# Patient Record
Sex: Female | Born: 1960 | Race: Black or African American | Hispanic: No | Smoking: Never smoker
Health system: Southern US, Community
[De-identification: ages and names within clinical notes are randomized; demographics above are authoritative.]

## PROBLEM LIST (undated history)

## (undated) DIAGNOSIS — I10 Essential (primary) hypertension: Secondary | ICD-10-CM

## (undated) DIAGNOSIS — G43909 Migraine, unspecified, not intractable, without status migrainosus: Secondary | ICD-10-CM

## (undated) DIAGNOSIS — T7840XA Allergy, unspecified, initial encounter: Secondary | ICD-10-CM

## (undated) DIAGNOSIS — Z789 Other specified health status: Secondary | ICD-10-CM

## (undated) DIAGNOSIS — IMO0001 Reserved for inherently not codable concepts without codable children: Secondary | ICD-10-CM

## (undated) DIAGNOSIS — E669 Obesity, unspecified: Secondary | ICD-10-CM

## (undated) HISTORY — DX: Allergy, unspecified, initial encounter: T78.40XA

## (undated) HISTORY — PX: ABDOMINAL HYSTERECTOMY: SHX81

## (undated) HISTORY — PX: TUBAL LIGATION: SHX77

## (undated) HISTORY — DX: Obesity, unspecified: E66.9

## (undated) HISTORY — PX: PARTIAL HYSTERECTOMY: SHX80

## (undated) HISTORY — DX: Essential (primary) hypertension: I10

## (undated) HISTORY — DX: Migraine, unspecified, not intractable, without status migrainosus: G43.909

---

## 1991-04-11 HISTORY — PX: HYSTERECTOMY: SHX81

## 2013-08-14 ENCOUNTER — Emergency Department: Payer: No Typology Code available for payment source

## 2013-08-14 ENCOUNTER — Emergency Department
Admission: EM | Admit: 2013-08-14 | Discharge: 2013-08-14 | Disposition: A | Discharge status: Home / Self Care | Payer: No Typology Code available for payment source | Attending: Emergency Medicine | Admitting: Emergency Medicine

## 2013-08-14 DIAGNOSIS — R519 Headache, unspecified: Secondary | ICD-10-CM

## 2013-08-14 DIAGNOSIS — R51 Headache: Secondary | ICD-10-CM | POA: Insufficient documentation

## 2013-08-14 DIAGNOSIS — I10 Essential (primary) hypertension: Secondary | ICD-10-CM | POA: Insufficient documentation

## 2013-08-14 DIAGNOSIS — Z8669 Personal history of other diseases of the nervous system and sense organs: Secondary | ICD-10-CM | POA: Insufficient documentation

## 2013-08-14 DIAGNOSIS — R0789 Other chest pain: Secondary | ICD-10-CM | POA: Insufficient documentation

## 2013-08-14 HISTORY — DX: Essential (primary) hypertension: I10

## 2013-08-14 HISTORY — DX: Migraine, unspecified, not intractable, without status migrainosus: G43.909

## 2013-08-14 HISTORY — DX: Obesity, unspecified: E66.9

## 2013-08-14 LAB — CBC AND DIFFERENTIAL
Basophils Absolute Automated: 0.01 10*3/uL (ref 0.00–0.20)
Basophils Automated: 0 %
Eosinophils Absolute Automated: 0.14 10*3/uL (ref 0.00–0.70)
Eosinophils Automated: 2 %
Hematocrit: 42.3 % (ref 37.0–47.0)
Hgb: 13.9 g/dL (ref 12.0–16.0)
Immature Granulocytes Absolute: 0.01 10*3/uL
Immature Granulocytes: 0 %
Lymphocytes Absolute Automated: 1.96 10*3/uL (ref 0.50–4.40)
Lymphocytes Automated: 28 %
MCH: 29 pg (ref 28.0–32.0)
MCHC: 32.9 g/dL (ref 32.0–36.0)
MCV: 88.3 fL (ref 80.0–100.0)
MPV: 10 fL (ref 9.4–12.3)
Monocytes Absolute Automated: 0.51 10*3/uL (ref 0.00–1.20)
Monocytes: 7 %
Neutrophils Absolute: 4.41 10*3/uL (ref 1.80–8.10)
Neutrophils: 63 %
Nucleated RBC: 0 /100 WBC (ref 0–1)
Platelets: 296 10*3/uL (ref 140–400)
RBC: 4.79 10*6/uL (ref 4.20–5.40)
RDW: 13 % (ref 12–15)
WBC: 7.03 10*3/uL (ref 3.50–10.80)

## 2013-08-14 LAB — COMPREHENSIVE METABOLIC PANEL
ALT: 16 U/L (ref 0–55)
AST (SGOT): 14 U/L (ref 5–34)
Albumin/Globulin Ratio: 0.9 (ref 0.9–2.2)
Albumin: 3.3 g/dL — ABNORMAL LOW (ref 3.5–5.0)
Alkaline Phosphatase: 112 U/L — ABNORMAL HIGH (ref 37–106)
Anion Gap: 6 (ref 5.0–15.0)
BUN: 12 mg/dL (ref 7.0–19.0)
Bilirubin, Total: 0.4 mg/dL (ref 0.2–1.2)
CO2: 27 mEq/L (ref 22–29)
Calcium: 9.6 mg/dL (ref 8.5–10.5)
Chloride: 109 mEq/L (ref 100–111)
Creatinine: 0.9 mg/dL (ref 0.6–1.0)
Globulin: 3.7 g/dL — ABNORMAL HIGH (ref 2.0–3.6)
Glucose: 92 mg/dL (ref 70–100)
Potassium: 4 mEq/L (ref 3.5–5.1)
Protein, Total: 7 g/dL (ref 6.0–8.3)
Sodium: 142 mEq/L (ref 136–145)

## 2013-08-14 LAB — TROPONIN I: Troponin I: <0.01 ng/mL

## 2013-08-14 LAB — GFR: EGFR: >60

## 2013-08-14 LAB — HEMOLYSIS INDEX: Hemolysis Index: 8 (ref 0–18)

## 2013-08-14 MED ORDER — SODIUM CHLORIDE 0.9 % IV BOLUS
1000.0000 mL | Freq: Once | INTRAVENOUS | Status: AC
Start: 2013-08-14 — End: 2013-08-14
  Administered 2013-08-14: 1000 mL via INTRAVENOUS

## 2013-08-14 MED ORDER — BUTALBITAL-APAP-CAFFEINE 50-325-40 MG PO TABS
1.0000 | ORAL_TABLET | ORAL | Status: AC | PRN
Start: 2013-08-14 — End: ?

## 2013-08-14 MED ORDER — ONDANSETRON HCL 4 MG/2ML IJ SOLN
4.0000 mg | Freq: Once | INTRAMUSCULAR | Status: AC
Start: 2013-08-14 — End: 2013-08-14
  Administered 2013-08-14: 4 mg via INTRAVENOUS
  Filled 2013-08-14: qty 2

## 2013-08-14 MED ORDER — KETOROLAC TROMETHAMINE 30 MG/ML IJ SOLN
30.0000 mg | Freq: Once | INTRAMUSCULAR | Status: AC
Start: 2013-08-14 — End: 2013-08-14
  Administered 2013-08-14: 30 mg via INTRAVENOUS
  Filled 2013-08-14: qty 1

## 2013-08-14 MED ORDER — MAGNESIUM SULFATE IN D5W 10-5 MG/ML-% IV SOLN
1.0000 g | Freq: Once | INTRAVENOUS | Status: AC
Start: 2013-08-14 — End: 2013-08-14
  Administered 2013-08-14: 1 g via INTRAVENOUS
  Filled 2013-08-14: qty 100

## 2013-08-14 NOTE — ED Provider Notes (Signed)
EMERGENCY DEPARTMENT HISTORY AND PHYSICAL EXAM     Physician/Midlevel provider first contact with patient: 08/14/13 0951         Date: 08/14/2013  Patient Name: Monique Dean    History of Presenting Illness     Chief Complaint   Patient presents with   . Chest Pain   . Headache       History Provided By: Patient    Chief Complaint: Headache  Onset: Last night  Timing: Persistent  Location: Diffuse  Quality: Similar to past headaches  Severity: Moderate  Modifying Factors: No pain medication PTA.  Associated Symptoms: Chest pain, sound sensitivity, difficulty sleeping, bilateral foot swelling, and anxiety    Additional History: Monique Dean is a 53 y.o. female presenting to the ED with persistent diffuse headache since last night described as similar to past headaches. Pt has experienced relief in the past with Excedrin, but did not take any PTA. Patient also complains of gradually worsening chest pain ("pressure") radiating into neck since last night. Pt reports she has had similar CP with anxiety in the past, and it is not improved or worsened by anything. Patient also complains of sound sensitivity, difficulty sleeping, bilateral foot swelling, and anxiety, but denies any other Sx including head trauma, fever, chills, photophobia, back pain, abdominal pain, urine Sx, change in BM, cough, cold, or congestion. Patient admits to Hx migraines (since 53 y.o.) and states that she has had imaging and neurological workup in the past, but her neurologist was detained and she never received her results. Pt also admits to Hx HTN and is S/P partial hysterectomy and tubal ligation, but denies Hx cardiac issues (has had unremarkable cardiac workup in the past). Patient may have allergies to Reglan, Toradol, Compazine, and Iodine.     PCP: Gladis Riffle, MD      Current Facility-Administered Medications   Medication Dose Route Frequency Provider Last Rate Last Dose   . [COMPLETED] ketorolac (TORADOL) injection 30 mg  30 mg  Intravenous Once Windell Hummingbird, DO   30 mg at 08/14/13 1053   . [COMPLETED] magnesium sulfate 1g in dextrose 5% IVPB (premix)  1 g Intravenous Once Windell Hummingbird, DO   1 g at 08/14/13 1053   . [COMPLETED] ondansetron (ZOFRAN) injection 4 mg  4 mg Intravenous Once Windell Hummingbird, DO   4 mg at 08/14/13 1052   . [COMPLETED] sodium chloride 0.9 % bolus 1,000 mL  1,000 mL Intravenous Once Windell Hummingbird, DO   1,000 mL at 08/14/13 1059     Current Outpatient Prescriptions   Medication Sig Dispense Refill   . butalbital-acetaminophen-caffeine (FIORICET) 50-325-40 MG per tablet Take 1 tablet by mouth every 4 (four) hours as needed for Pain.  30 tablet  0   . verapamil (VERELAN PM) 240 MG 24 hr capsule Take 240 mg by mouth daily.           Past History     Past Medical History:  Past Medical History   Diagnosis Date   . Obesity    . Migraine headache    . Hypertension        Past Surgical History:  Past Surgical History   Procedure Date   . Partial hysterectomy    . Tubal ligation        Family History:  Family History   Problem Relation Age of Onset   . Hypertension Mother    . Hypertension Sister    . Hypertension  Maternal Grandmother        Social History:  History   Substance Use Topics   . Smoking status: Never Smoker    . Smokeless tobacco: Not on file   . Alcohol Use: Yes      Comment: rarely       Allergies:  Allergies   Allergen Reactions   . Reglan (Metoclopramide) Other (See Comments)     Lock jaw   . Toradol (Ketorolac Tromethamine) Other (See Comments)     Pt unsure if the reglan or the toradol caused locked jaw but wants to avoid both medicines because they were given close together in time   . Compazine (Prochlorperazine) Anxiety   . Iodine Rash and Other (See Comments)     Reaction also was chest heaviness. Allergic to ct scan dye       Review of Systems     Review of Systems   Constitutional:        + Difficulty sleeping   HENT:        + Sensitivity to sound   Cardiovascular: Positive for  chest pain and leg swelling (Bilateral feet).   Musculoskeletal: Positive for neck pain (Radiating from chest).   Neurological: Positive for headaches.   Endo/Heme/Allergies: Positive for environmental allergies.   Psychiatric/Behavioral: The patient is nervous/anxious.    All other systems reviewed and are negative.          Physical Exam   BP 169/90  Pulse 84  Temp 97.9 F (36.6 C) (Oral)  Resp 14  Ht 1.753 m  Wt 136.079 kg  BMI 44.28 kg/m2  SpO2 97%    Physical Exam   Constitutional: She is oriented to person, place, and time and well-developed, well-nourished, and in no distress. No distress.   HENT:   Head: Normocephalic and atraumatic.   Right Ear: External ear normal.   Left Ear: External ear normal.   Nose: Nose normal.   Mouth/Throat: Oropharynx is clear and moist.   Eyes: Conjunctivae normal and EOM are normal. Pupils are equal, round, and reactive to light.   Neck: Normal range of motion. Neck supple. No JVD present.   Cardiovascular: Normal rate, regular rhythm, normal heart sounds and intact distal pulses.    No murmur heard.  Pulmonary/Chest: Effort normal and breath sounds normal. No respiratory distress. She has no wheezes. She exhibits no tenderness.   Abdominal: Soft. She exhibits no distension. There is no tenderness. There is no rebound and no guarding.   Musculoskeletal: Normal range of motion. She exhibits no edema and no tenderness.   Neurological: She is alert and oriented to person, place, and time. No cranial nerve deficit. She exhibits normal muscle tone. Coordination normal. GCS score is 15.   Skin: Skin is warm and dry. No rash noted.   Psychiatric: Mood, affect and judgment normal.       Diagnostic Study Results     Labs -     Results     Procedure Component Value Units Date/Time    Troponin I [[id number]] Collected:08/14/13 1036    Specimen Information:Blood Updated:08/14/13 1111     Troponin I <0.01 ng/mL     Comprehensive metabolic panel [[id number]]  (Abnormal)  Collected:08/14/13 1036    Specimen Information:Blood Updated:08/14/13 1104     Glucose 92 mg/dL      BUN 78.2 mg/dL      Creatinine 0.9 mg/dL      Sodium 956 mEq/L      Potassium 4.0 mEq/L  Chloride 109 mEq/L      CO2 27 mEq/L      CALCIUM 9.6 mg/dL      Protein, Total 7.0 g/dL      Albumin 3.3 (L) g/dL      AST (SGOT) 14 U/L      ALT 16 U/L      Alkaline Phosphatase 112 (H) U/L      Bilirubin, Total 0.4 mg/dL      Globulin 3.7 (H) g/dL      Albumin/Globulin Ratio 0.9      Anion Gap 6.0     Hemolysis index [[id number]] Collected:08/14/13 1036     Hemolysis Index 8 Updated:08/14/13 1104    GFR [[id number]] Collected:08/14/13 1036     EGFR >60.0 Updated:08/14/13 1104    CBC and differential [[id number]] Collected:08/14/13 1036    Specimen Information:Blood / Blood Updated:08/14/13 1048     WBC 7.03 x10 3/uL      RBC 4.79 x10 6/uL      Hgb 13.9 g/dL      Hematocrit 08.6 %      MCV 88.3 fL      MCH 29.0 pg      MCHC 32.9 g/dL      RDW 13 %      Platelets 296 x10 3/uL      MPV 10.0 fL      Neutrophils 63 %      Lymphocytes Automated 28 %      Monocytes 7 %      Eosinophils Automated 2 %      Basophils Automated 0 %      Immature Granulocyte 0 %      Nucleated RBC 0 /100 WBC      Neutrophils Absolute 4.41 x10 3/uL      Abs Lymph Automated 1.96 x10 3/uL      Abs Mono Automated 0.51 x10 3/uL      Abs Eos Automated 0.14 x10 3/uL      Absolute Baso Automated 0.01 x10 3/uL      Absolute Immature Granulocyte 0.01 x10 3/uL           Radiologic Studies -   Radiology Results (24 Hour)     Procedure Component Value Units Date/Time    XR Chest 2 Views [[id number]] Collected:08/14/13 1134    Order Status:Completed  Updated:08/14/13 1139    Narrative:    XR CHEST 2 VIEWS    CLINICAL INDICATION:   chest pain    COMPARISON: None    TECHNIQUE: The following radiographs were obtained per protocol:   XR  CHEST 2 VIEWS    FINDINGS:  Cardiomediastinal silhouette is within normal limits. No  alveolar infiltrate. No effusion. No active  disease.  The osseous  structures are unremarkable. No acute abnormalities are apparent.      Impression:         NO SIGNIFICANT ABNORMALITY.    Darnelle Maffucci, MD   08/14/2013 11:35 AM        .      Medical Decision Making   I am the first provider for this patient.    I reviewed the vital signs, available nursing notes, past medical history, past surgical history, family history and social history.    Vital Signs-Reviewed the patient's vital signs.     Patient Vitals for the past 12 hrs:   BP Temp Pulse Resp   08/14/13 1146 169/90 mmHg 97.9 F (36.6 C) 84  14    08/14/13  0942 192/93 mmHg 97.5 F (36.4 C) 80  16        Pulse Oximetry Analysis - Normal 97% on RA    EKG:  Interpreted by the EP.   Time Interpreted: 10:01 AM   Rate: 75 bpm   Rhythm: Normal Sinus Rhythm    Interpretation: Normal intervals. No STEMI.   Comparison: No prior study is available for comparison.    Old Medical Records: Nursing notes.     ED Course:   11:47 AM  Pt is feeling better and would like to go home.  Discussed test results with pt and counseled on diagnosis, f/u plans, and signs and symptoms when to return to ED.  Pt is stable and ready for discharge.      Provider Notes: HDN, afebrile with chest pain and headache; chest pain starting after headaches last night.  Neuro intact, no h/o trauma/fevers.  Prior imaging/work up done by neurology without acute findings.  Chest pain, constant since last night, patient relates to anxiety; no exertional component and normal EKG/CXR/TROP in ED.  Doubt ACS.  As for headaches, patient comfortable with outpatient treatment and will follow up with PCP.    Procedures:    Core Measures:    Critical Care Time:     Diagnosis     Clinical Impression:   1. Headache    2. Non-cardiac chest pain        _______________________________    Attestations:  This note is prepared by Cathren Laine, acting as Scribe for Channing Mutters, MD.    Channing Mutters, MD: The scribe's documentation has been prepared under my  direction and personally reviewed by me in its entirety.  I confirm that the note above accurately reflects all work, treatment, procedures, and medical decision making performed by me.      _______________________________          Windell Hummingbird, DO  08/14/13 1238

## 2013-08-14 NOTE — ED Notes (Signed)
Pt said the constant heaviness to the sternal area radiate up the right neck chest pain started yesterday evening. No n/v.pt denies photophobia. Pt c/o aching, throbbing headache to the top of the head and forehead area and the back of the neck . Denies injury to any part of her body.

## 2013-08-17 LAB — ECG 12-LEAD
Atrial Rate: 75 {beats}/min
P Axis: 18 degrees
P-R Interval: 140 ms
Q-T Interval: 398 ms
QRS Duration: 78 ms
QTC Calculation (Bezet): 444 ms
R Axis: -9 degrees
T Axis: -2 degrees
Ventricular Rate: 75 {beats}/min

## 2013-09-05 ENCOUNTER — Ambulatory Visit
Payer: No Typology Code available for payment source | Attending: Student in an Organized Health Care Education/Training Program

## 2013-09-05 ENCOUNTER — Other Ambulatory Visit: Payer: Self-pay | Admitting: Student in an Organized Health Care Education/Training Program

## 2013-09-05 DIAGNOSIS — Z1231 Encounter for screening mammogram for malignant neoplasm of breast: Secondary | ICD-10-CM

## 2013-09-08 ENCOUNTER — Other Ambulatory Visit: Payer: Self-pay | Admitting: Student in an Organized Health Care Education/Training Program

## 2013-09-08 DIAGNOSIS — R928 Other abnormal and inconclusive findings on diagnostic imaging of breast: Secondary | ICD-10-CM

## 2013-11-10 ENCOUNTER — Ambulatory Visit: Payer: No Typology Code available for payment source

## 2015-01-19 ENCOUNTER — Ambulatory Visit (INDEPENDENT_AMBULATORY_CARE_PROVIDER_SITE_OTHER): Payer: BLUE CROSS/BLUE SHIELD | Admitting: Physician Assistant

## 2015-01-19 VITALS — BP 142/84 | HR 76 | Temp 98.5°F | Resp 16 | Ht 68.5 in | Wt 318.4 lb

## 2015-01-19 DIAGNOSIS — I10 Essential (primary) hypertension: Secondary | ICD-10-CM

## 2015-01-19 DIAGNOSIS — J069 Acute upper respiratory infection, unspecified: Secondary | ICD-10-CM | POA: Diagnosis not present

## 2015-01-19 MED ORDER — IPRATROPIUM BROMIDE 0.03 % NA SOLN: 2.0000 | Freq: Two times a day (BID) | NASAL | Status: DC

## 2015-01-19 MED ORDER — HYDROCOD POLST-CPM POLST ER 10-8 MG/5ML PO SUER: 5.0000 mL | Freq: Every evening | ORAL | Status: DC | PRN

## 2015-01-19 MED ORDER — BENZONATATE 100 MG PO CAPS: 100.0000 mg | ORAL_CAPSULE | Freq: Three times a day (TID) | ORAL | Status: DC | PRN

## 2015-01-19 MED ORDER — VERAPAMIL HCL ER 240 MG PO CP24: 240.0000 mg | ORAL_CAPSULE | Freq: Once | ORAL | Status: DC

## 2015-01-19 NOTE — Progress Notes (Signed)
Subjective:    Patient ID: Kimberly Mcintyre, female    DOB: 29-Jun-1960, 54 y.o.   MRN: [id number]  HPI Patient presents for sore throat and refill of verapamil.   States that has had sore throat and cough for 1 week. Still able to swallow ok, but has begun loosing her voice. Cough is productive at times, but is keeping her up at night. Coughing spasms have caused HA and CP. Additionally endorses rhinorrhea and congestion. Denies fever, N/V, SOB, sinus pressure. Has seasonal allergies, but no h/o asthma. No known sick contacts.   Moved to [place] 2 months ago from [place], [place] and has been out of verapamil for one month. Denies SOB, palpitations, edema, dizziness, or vision change. Has not found a PCP yet.   Allergies  Allergen Reactions  . 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 Contrast Media [Iodinated Diagnostic Agents] Rash and Other (See Comments)    Dye for the CT contrast, causes heaviness in her chest   Review of Systems  Constitutional: Negative for fever, chills and fatigue.  HENT: Positive for congestion, postnasal drip, rhinorrhea and sore throat. Negative for ear pain, sinus pressure, sneezing and trouble swallowing.   Eyes: Negative.  Negative for visual disturbance.  Respiratory: Positive for cough. Negative for shortness of breath and wheezing.   Cardiovascular: Negative for palpitations and leg swelling.  Gastrointestinal: Negative for nausea and vomiting.  Neurological: Positive for headaches. Negative for dizziness and light-headedness.       Objective:   Physical Exam  Constitutional: She is oriented to person, place, and time. She appears well-developed and well-nourished. No distress.  Blood pressure 142/84, pulse 76, temperature 98.5 F (36.9 C), temperature source Oral, resp. rate 16, height 5' 8.5" (1.74 m), weight 318 lb 6.4 oz (144.425 kg), SpO2  98 %.   HENT:  Head: Normocephalic and atraumatic.  Right Ear: Tympanic membrane, external ear and ear canal normal.  Left Ear: Tympanic membrane, external ear and ear canal normal.  Nose: Rhinorrhea present. Right sinus exhibits no maxillary sinus tenderness and no frontal sinus tenderness. Left sinus exhibits no maxillary sinus tenderness and no frontal sinus tenderness.  Mouth/Throat: Uvula is midline and mucous membranes are normal. Posterior oropharyngeal erythema present. No oropharyngeal exudate or posterior oropharyngeal edema.  Eyes: Conjunctivae are normal. Pupils are equal, round, and reactive to light. Right eye exhibits no discharge. Left eye exhibits no discharge. No scleral icterus.  Neck: Normal range of motion. Neck supple. No thyromegaly present.  Cardiovascular: Normal rate, regular rhythm and normal heart sounds.  Exam reveals no gallop and no friction rub.   No murmur heard. Pulmonary/Chest: Effort normal and breath sounds normal. No respiratory distress. She has no decreased breath sounds. She has no wheezes. She has no rhonchi. She has no rales.  Abdominal: Soft. Bowel sounds are normal. She exhibits no distension. There is no tenderness. There is no rebound and no guarding.  Lymphadenopathy:    She has no cervical adenopathy.  Neurological: She is alert and oriented to person, place, and time.  Skin: Skin is warm and dry. No rash noted. She is not diaphoretic. No erythema.      Assessment & Plan:  1. Essential hypertension Should f/u in 1 month for BP f/u. Lifestyle modifications discussed. - verapamil (VERELAN PM) 240 MG 24 hr capsule; Take 1 capsule (240 mg total)  by mouth once.  Dispense: 30 capsule; Refill: 1  2. Acute upper respiratory infection Plenty of fluid. - ipratropium (ATROVENT) 0.03 % nasal spray; Place 2 sprays into both nostrils 2 (two) times daily.  Dispense: 30 mL; Refill: 0 - benzonatate (TESSALON) 100 MG capsule; Take 1-2 capsules (100-200 mg  total) by mouth 3 (three) times daily as needed for cough.  Dispense: 40 capsule; Refill: 0 - chlorpheniramine-HYDROcodone (TUSSIONEX PENNKINETIC ER) 10-8 MG/5ML SUER; Take 5 mLs by mouth at bedtime as needed for cough.  Dispense: 75 mL; Refill: 0   Keionna Kinnaird PA-C  [facility] 01/19/2015 1:45 PM

## 2015-01-19 NOTE — Patient Instructions (Signed)
Upper Respiratory Infection, Adult Most upper respiratory infections (URIs) are a viral infection of the air passages leading to the lungs. A URI affects the nose, throat, and upper air passages. The most common type of URI is nasopharyngitis and is typically referred to as "the common cold." URIs run their course and usually go away on their own. Most of the time, a URI does not require medical attention, but sometimes a bacterial infection in the upper airways can follow a viral infection. This is called a secondary infection. Sinus and middle ear infections are common types of secondary upper respiratory infections. Bacterial pneumonia can also complicate a URI. A URI can worsen asthma and chronic obstructive pulmonary disease (COPD). Sometimes, these complications can require emergency medical care and may be life threatening.  CAUSES Almost all URIs are caused by viruses. A virus is a type of germ and can spread from one person to another.  RISKS FACTORS You may be at risk for a URI if:   You smoke.   You have chronic heart or lung disease.  You have a weakened defense (immune) system.   You are very young or very old.   You have nasal allergies or asthma.  You work in crowded or poorly ventilated areas.  You work in health care facilities or schools. SIGNS AND SYMPTOMS  Symptoms typically develop 2-3 days after you come in contact with a cold virus. Most viral URIs last 7-10 days. However, viral URIs from the influenza virus (flu virus) can last 14-18 days and are typically more severe. Symptoms may include:   Runny or stuffy (congested) nose.   Sneezing.   Cough.   Sore throat.   Headache.   Fatigue.   Fever.   Loss of appetite.   Pain in your forehead, behind your eyes, and over your cheekbones (sinus pain).  Muscle aches.  DIAGNOSIS  Your health care provider may diagnose a URI by:  Physical exam.  Tests to check that your symptoms are not due to  another condition such as:  Strep throat.  Sinusitis.  Pneumonia.  Asthma. TREATMENT  A URI goes away on its own with time. It cannot be cured with medicines, but medicines may be prescribed or recommended to relieve symptoms. Medicines may help:  Reduce your fever.  Reduce your cough.  Relieve nasal congestion. HOME CARE INSTRUCTIONS   Take medicines only as directed by your health care provider.   Gargle warm saltwater or take cough drops to comfort your throat as directed by your health care provider.  Use a warm mist humidifier or inhale steam from a shower to increase air moisture. This may make it easier to breathe.  Drink enough fluid to keep your urine clear or pale yellow.   Eat soups and other clear broths and maintain good nutrition.   Rest as needed.   Return to work when your temperature has returned to normal or as your health care provider advises. You may need to stay home longer to avoid infecting others. You can also use a face mask and careful hand washing to prevent spread of the virus.  Increase the usage of your inhaler if you have asthma.   Do not use any tobacco products, including cigarettes, chewing tobacco, or electronic cigarettes. If you need help quitting, ask your health care provider. PREVENTION  The best way to protect yourself from getting a cold is to practice good hygiene.   Avoid oral or hand contact with people with cold   symptoms.   Wash your hands often if contact occurs.  There is no clear evidence that vitamin C, vitamin E, echinacea, or exercise reduces the chance of developing a cold. However, it is always recommended to get plenty of rest, exercise, and practice good nutrition.  SEEK MEDICAL CARE IF:   You are getting worse rather than better.   Your symptoms are not controlled by medicine.   You have chills.  You have worsening shortness of breath.  You have brown or red mucus.  You have yellow or brown nasal  discharge.  You have pain in your face, especially when you bend forward.  You have a fever.  You have swollen neck glands.  You have pain while swallowing.  You have white areas in the back of your throat. SEEK IMMEDIATE MEDICAL CARE IF:   You have severe or persistent:  Headache.  Ear pain.  Sinus pain.  Chest pain.  You have chronic lung disease and any of the following:  Wheezing.  Prolonged cough.  Coughing up blood.  A change in your usual mucus.  You have a stiff neck.  You have changes in your:  Vision.  Hearing.  Thinking.  Mood. MAKE SURE YOU:   Understand these instructions.  Will watch your condition.  Will get help right away if you are not doing well or get worse.   This information is not intended to replace advice given to you by your health care provider. Make sure you discuss any questions you have with your health care provider.   Document Released: 09/20/2000 Document Revised: 08/11/2014 Document Reviewed: 07/02/2013 Elsevier Interactive Patient Education 2016 [employer]  

## 2015-03-18 ENCOUNTER — Encounter (HOSPITAL_COMMUNITY): Payer: Self-pay | Admitting: Emergency Medicine

## 2015-03-18 ENCOUNTER — Emergency Department (HOSPITAL_COMMUNITY)
Admission: EM | Admit: 2015-03-18 | Discharge: 2015-03-18 | Disposition: A | Discharge status: Home / Self Care | Payer: BLUE CROSS/BLUE SHIELD | Attending: Emergency Medicine | Admitting: Emergency Medicine

## 2015-03-18 ENCOUNTER — Emergency Department (HOSPITAL_COMMUNITY): Payer: BLUE CROSS/BLUE SHIELD

## 2015-03-18 DIAGNOSIS — R079 Chest pain, unspecified: Secondary | ICD-10-CM | POA: Insufficient documentation

## 2015-03-18 DIAGNOSIS — G43909 Migraine, unspecified, not intractable, without status migrainosus: Secondary | ICD-10-CM | POA: Diagnosis not present

## 2015-03-18 DIAGNOSIS — Z79899 Other long term (current) drug therapy: Secondary | ICD-10-CM | POA: Insufficient documentation

## 2015-03-18 DIAGNOSIS — I10 Essential (primary) hypertension: Secondary | ICD-10-CM | POA: Diagnosis not present

## 2015-03-18 DIAGNOSIS — Z7982 Long term (current) use of aspirin: Secondary | ICD-10-CM | POA: Insufficient documentation

## 2015-03-18 HISTORY — DX: Essential (primary) hypertension: I10

## 2015-03-18 HISTORY — DX: Migraine, unspecified, not intractable, without status migrainosus: G43.909

## 2015-03-18 LAB — BASIC METABOLIC PANEL
Anion gap: 7 (ref 5–15)
BUN: 19 mg/dL (ref 6–20)
CO2: 26 mmol/L (ref 22–32)
Calcium: 9.2 mg/dL (ref 8.9–10.3)
Chloride: 108 mmol/L (ref 101–111)
Creatinine, Ser: 0.88 mg/dL (ref 0.44–1.00)
GFR calc Af Amer: >60 mL/min (ref >60)
GFR calc non Af Amer: >60 mL/min (ref >60)
Glucose, Bld: 93 mg/dL (ref 65–99)
Potassium: 4.1 mmol/L (ref 3.5–5.1)
Sodium: 141 mmol/L (ref 135–145)

## 2015-03-18 LAB — CBC WITH DIFFERENTIAL/PLATELET
Basophils Absolute: 0 10*3/uL (ref 0.0–0.1)
Basophils Relative: 0 %
Eosinophils Absolute: 0.1 10*3/uL (ref 0.0–0.7)
Eosinophils Relative: 1 %
HCT: 42.8 % (ref 36.0–46.0)
Hemoglobin: 13.8 g/dL (ref 12.0–15.0)
Lymphocytes Relative: 31 %
Lymphs Abs: 2.5 10*3/uL (ref 0.7–4.0)
MCH: 29.1 pg (ref 26.0–34.0)
MCHC: 32.2 g/dL (ref 30.0–36.0)
MCV: 90.1 fL (ref 78.0–100.0)
Monocytes Absolute: 0.6 10*3/uL (ref 0.1–1.0)
Monocytes Relative: 7 %
Neutro Abs: 4.7 10*3/uL (ref 1.7–7.7)
Neutrophils Relative %: 61 %
Platelets: 262 10*3/uL (ref 150–400)
RBC: 4.75 MIL/uL (ref 3.87–5.11)
RDW: 13.4 % (ref 11.5–15.5)
WBC: 7.9 10*3/uL (ref 4.0–10.5)

## 2015-03-18 LAB — TROPONIN I
Troponin I: <0.03 ng/mL (ref <0.031)
Troponin I: <0.03 ng/mL (ref <0.031)

## 2015-03-18 MED ORDER — DIAZEPAM 5 MG/ML IJ SOLN
5.0000 mg | Freq: Once | INTRAMUSCULAR | Status: AC
  Administered 2015-03-18: 5 mg via INTRAVENOUS
  Filled 2015-03-18: qty 2

## 2015-03-18 NOTE — ED Notes (Signed)
Pt arrives via [facility] for c/o chest pressure and palpitations that began while she was working. Pt denies sob/n/v, reports some dizziness with activity. Pt received 1 sl nitro and 324mg  asa prior to arrival. Pt alert/oriented, nad.

## 2015-03-18 NOTE — Discharge Instructions (Signed)
Nonspecific Chest Pain It is often hard to find the cause of chest pain. There is always a chance that your pain could be related to something serious, such as a heart attack or a blood clot in your lungs. Chest pain can also be caused by conditions that are not life-threatening. If you have chest pain, it is very important to follow up with your doctor.  HOME CARE  If you were prescribed an antibiotic medicine, finish it all even if you start to feel better.  Avoid any activities that cause chest pain.  Do not use any tobacco products, including cigarettes, chewing tobacco, or electronic cigarettes. If you need help quitting, ask your doctor.  Do not drink alcohol.  Take medicines only as told by your doctor.  Keep all follow-up visits as told by your doctor. This is important. This includes any further testing if your chest pain does not go away.  Your doctor may tell you to keep your head raised (elevated) while you sleep.  Make lifestyle changes as told by your doctor. These may include:  Getting regular exercise. Ask your doctor to suggest some activities that are safe for you.  Eating a heart-healthy diet. Your doctor or a diet specialist (dietitian) can help you to learn healthy eating options.  Maintaining a healthy weight.  Managing diabetes, if necessary.  Reducing stress. GET HELP IF:  Your chest pain does not go away, even after treatment.  You have a rash with blisters on your chest.  You have a fever. GET HELP RIGHT AWAY IF:  Your chest pain is worse.  You have an increasing cough, or you cough up blood.  You have severe belly (abdominal) pain.  You feel extremely weak.  You pass out (faint).  You have chills.  You have sudden, unexplained chest discomfort.  You have sudden, unexplained discomfort in your arms, back, neck, or jaw.  You have shortness of breath at any time.  You suddenly start to sweat, or your skin gets clammy.  You feel  nauseous.  You vomit.  You suddenly feel light-headed or dizzy.  Your heart begins to beat quickly, or it feels like it is skipping beats. These symptoms may be an emergency. Do not wait to see if the symptoms will go away. Get medical help right away. Call your local emergency services (911 in the U[place] Do not drive yourself to the hospital.   This information is not intended to replace advice given to you by your health care provider. Make sure you discuss any questions you have with your health care provider.   Document Released: 09/13/2007 Document Revised: 04/17/2014 Document Reviewed: 10/31/2013 Elsevier Interactive Patient Education 2016 [employer]   Emergency Department Resource Guide 1) Find a Doctor and Pay Out of Pocket Although you won't have to find out who is covered by your insurance plan, it is a good idea to ask around and get recommendations. You will then need to call the office and see if the doctor you have chosen will accept you as a new patient and what types of options they offer for patients who are self-pay. Some doctors offer discounts or will set up payment plans for their patients who do not have insurance, but you will need to ask so you aren't surprised when you get to your appointment.  2) Contact Your Local Health Department Not all health departments have doctors that can see patients for sick visits, but many do, so it is worth a  call to see if yours does. If you don't know where your local health department is, you can check in your phone book. The CDC also has a tool to help you locate your state's health department, and many state websites also have listings of all of their local health departments.  3) Find a Walk-in Clinic If your illness is not likely to be very severe or complicated, you may want to try a walk in clinic. These are popping up all over the country in pharmacies, drugstores, and shopping centers. They're usually staffed by nurse  practitioners or physician assistants that have been trained to treat common illnesses and complaints. They're usually fairly quick and inexpensive. However, if you have serious medical issues or chronic medical problems, these are probably not your best option.  No Primary Care Doctor: - Call Health Connect at  [phone] - they can help you locate a primary care doctor that  accepts your insurance, provides certain services, etc. - Physician Referral Service- [phone]  Chronic Pain Problems: Organization         Address  Phone   Notes  [facility]  [phone] Patients need to be referred by their primary care doctor.   Medication Assistance: Organization         Address  Phone   Notes  [facility] [address] [place], [place] [address] [phone] --Must be a resident of [facility] -- Must have NO insurance coverage whatsoever (no Medicaid/ Medicare, etc.) -- The pt. MUST have a primary care doctor that directs their care regularly and follows them in the community   [facility]  [phone]   [employer]  [phone]    Agencies that provide inexpensive medical care: Organization         Address  Phone   Notes  [facility]  [phone]   [facility]    [phone]   [facility] [address] [place], [place] [address] [phone]   [facility] [address] [place] [address], [place] [phone]   Planned Parenthood    [phone]   [facility]    [phone]   [facility]  [address], [place] Phone:  [phone], Fax:  [phone] Hours of Operation:  9 am - 6 pm, M-F.  Also accepts Medicaid/Medicare and self-pay.  [facility]  [address], [place] Phone: [phone], Fax: [phone]. Hours of Operation:  8:30 am -  5:30 pm, M-F.  Also accepts Medicaid and self-pay.  [facility] [place] [address] [place] Phone: [phone]   [facility] [address] Natasha Bence [place], [place] [phone], Ext. [phone] Mondays & Thursdays: 7-9 AM.  First 15 patients are seen on a first come, first serve basis.    Medicaid-accepting [facility] Providers:  Organization         Address  Phone   Notes  [facility] [address], [place] [phone] Also accepts self-pay patients.  [facility] [address] Laurell Josephs [place], [place]  [phone]   [facility] [address], [place] [phone]   [facility] [address] [place] [phone]   Renaye Rakers [address] [place],  [place]   [phone] Only accepts [place] [employer] patients after they have their name applied to their card.   Self-Pay (no insurance) in [facility]:  Organization         Address  Phone   Notes  Sickle Cell Patients, [facility] [address] [place], [place] [phone]   [facility] [address] [place], [place] [phone]   [facility] [place]  [address], [place] [phone]   [facility]/Dr. Osei-Bonsu  [address] [place] or [address], [place] [phone] Phone number for both [place] and [place] locations is the same.  [facility] [address], [place] [phone]   [facility] [address], [place] or [address] [phone]   [facility] [address] [place] [phone], phone; [phone], fax Sees patients 1st and 3rd Saturday of every month.  Must not qualify for public or private insurance (i.e. Medicaid, Medicare, [place] Health Choice,  Veterans' Benefits)  Household income should be no more than 200% of the poverty level The clinic cannot treat you if you are pregnant or think you are pregnant  Sexually transmitted diseases are not treated at the clinic.    Dental Care: Organization         Address  Phone  Notes  [facility] [address] [place], [place] [phone] Accepts children up to age 33 who are enrolled in [place] or [place] Health Choice; pregnant women with a Medicaid card; and children who have applied for Medicaid or [place] Health Choice, but were declined, whose parents can pay a reduced fee at time of service.  [facility]  [address], [place] [phone] Accepts children up to age 75 who are enrolled in [place] or [place] Health Choice; pregnant women with a Medicaid card; and children who have applied for Medicaid or [place] Health Choice, but were declined, whose parents can pay a reduced fee at time of service.  [facility]  [address] [place], [place] [phone] Patients are seen by appointment only. Walk-ins are not accepted. [facility] will see patients 3 years of age and older. Monday - Tuesday (8am-5pm) Most Wednesdays (8:30-5pm) $30 per visit, cash only  [facility]  [address], [facility] [phone] Patients are seen by appointment only. Walk-ins are not accepted. [facility] will see patients 73 years of age and older. One Wednesday Evening (Monthly: Volunteer Based).  $30 per visit, cash only  [employer]  [phone] for adults; Children under age 52, call Graduate Pediatric Dentistry at [phone]. Children aged 45-14, please call [phone] to request a pediatric application.  Dental services are provided in all areas of dental care including fillings, crowns and bridges, complete and  partial dentures, implants, gum treatment, root canals, and extractions. Preventive care is also provided. Treatment is provided to both adults and children. Patients are selected via a lottery and there is often a waiting list.   [facility] [address] [place]  [phone] [email or web address]   [facility] [address] [place], [place] [phone], Ext. [phone] Second and Fourth Thursday of each month,  opens at 6:30 AM; Clinic ends at 9 AM.  Patients are seen on a first-come first-served basis, and a limited number are seen during each clinic.   [facility]  [address] Ether Griffins [place], [place] [phone]   Eligibility Requirements You must have lived in [place], [place], or [place] counties for at least the last three months.   You cannot be eligible for state or federal sponsored [place], including [employer], [place], or [employer].   You generally cannot be eligible for healthcare insurance through your employer.    How to apply: Eligibility screenings are held every Tuesday and Wednesday afternoon from 1:00 pm until 4:00 pm. You do not need an appointment for the interview!  [facility] [address], [place], [place] [phone]   [facility] Health Department  [phone]   [facility] Health Department  [phone]   [facility] Health Department  [phone]    Behavioral Health Resources in the Community: Intensive Outpatient Programs Organization         Address  Phone  Notes  [facility] Services [address], [place], [place] [phone]   [facility] [address], [place], [place] [phone]   [facility] [address] [place], [place]  [phone]   [facility] Mental Health [address],  [place], [place] [phone] or [phone]   Substance Abuse Resources Organization          Address  Phone  Notes  [facility]  [phone]   [facility]  [phone]   [facility] [place]  [phone]   Floydene Flock  [phone]   Residential & Outpatient Substance Abuse Program  [phone]   Psychological Services Organization         Address  Phone  Notes  [facility] Behavioral Health  [phone]   [facility] Services  [phone]   [facility] Mental Health [address], [place] [phone] or [phone]    Mobile Crisis Teams Organization         Address  Phone  Notes  [facility], Mobile Crisis Care Unit  [phone]   [facility]  [address] [place], [place] [phone]   Doristine Locks [address] [place] [phone]    Self-Help/Support Groups Organization         Address  Phone             Notes  [facility] [place] - variety of support groups  [phone]- [phone] Call for more information  Narcotics Anonymous (NA), [facility] [address], [employer] [place]  2 meetings at this location   Statistician         Address  Phone  Notes  [facility] [address],    [place]  [phone]   [facility]  [address] [place] [address], [place], [place] [phone]   [facility] [address] Cordry [place], [place] [phone] Admissions: 8am-3pm M-F  [facility] [address],    [place], [place] [phone]   [facility] [address] Starling Manns [place], [place] [phone]   [facility] [address]  [place], [place] [phone]   [facility] - Intensive Outpatient [address] Laurell Joseph[address], [place], [place] [phone]   [facility] ([facility] [address]  [place], [place] [phone]  or [phone]   [facility] ([facility]) [address], [place], [place]  [phone] Accepts Medicaid  [facility] [address]  [place] [phone] Substance Abuse/Addiction Treatment   [facility] Organization         Address  Phone  Notes  [facility]  [phone]   Angie Fava, PhD [address] Ervin Knack [place], [place]   [phone] or [phone]   [facility] Behavioral   [address] [place], [place] [phone]   [facility] [address] [place], [place] [phone] Insurance/Medicaid/sponsorship through [facility] [address]                                    [place], [place] [phone] Therapy/tele-psych/case  [facility] [address], [place] [phone]    Dr. Lolly Mustache  [phone]   [facility]  [employer] [facility] Dept. 1) [address] [place] 2) [address], [place] 3)  [address], [place] [phone]  [phone]   [facility] Child Abuse Hotline 801-093-6293 or [phone] (After Hours)

## 2015-03-22 NOTE — ED Provider Notes (Signed)
CSN: [id number]     Arrival date & time 03/18/15  1605 History   First MD Initiated Contact with Patient 03/18/15 1613     Chief Complaint  Patient presents with  . Chest Pain     (Consider location/radiation/quality/duration/timing/severity/associated sxs/prior Treatment) HPI   54 year old female with chest pain. Onset shortly before arrival while at work. She describes sharp pain substernally to the left side of her chest. Last approximately 1-3 minutes and subsides. No appreciable exacerbating relieving factors. Denies any associated symptoms such as shortness of breath, nausea, diaphoresis. No fevers or chills. No cough. No unusual leg pain or swelling. Reports being under increased stress and symptoms may be related to this.   Past Medical History  Diagnosis Date  . Allergy   . Hypertension   . Migraines    Past Surgical History  Procedure Laterality Date  . Abdominal hysterectomy    . Tubal ligation     Family History  Problem Relation Age of Onset  . Hypertension Mother    Social History  Substance Use Topics  . Smoking status: Never Smoker   . Smokeless tobacco: None  . Alcohol Use: 0.0 oz/week    0 Standard drinks or equivalent per week   OB History    No data available     Review of Systems  All systems reviewed and negative, other than as noted in HPI.   Allergies  Reglan; Toradol; Compazine; and Contrast media  Home Medications   Prior to Admission medications   Medication Sig Start Date End Date Taking? Authorizing Provider  aspirin-acetaminophen-caffeine (EXCEDRIN MIGRAINE) [phone] MG tablet Take 1 tablet by mouth every 6 (six) hours as needed for headache.   Yes Historical Provider, MD  verapamil (VERELAN PM) 240 MG 24 hr capsule Take 1 capsule (240 mg total) by mouth once. Patient taking differently: Take 240 mg by mouth daily.  01/19/15  Yes Tishira R Brewington, PA-C  benzonatate (TESSALON) 100 MG capsule Take 1-2 capsules (100-200 mg total)  by mouth 3 (three) times daily as needed for cough. Patient not taking: Reported on 03/18/2015 01/19/15   Tishira R Brewington, PA-C  chlorpheniramine-HYDROcodone (TUSSIONEX PENNKINETIC ER) 10-8 MG/5ML SUER Take 5 mLs by mouth at bedtime as needed for cough. Patient not taking: Reported on 03/18/2015 01/19/15   Tishira R Brewington, PA-C  ipratropium (ATROVENT) 0.03 % nasal spray Place 2 sprays into both nostrils 2 (two) times daily. Patient not taking: Reported on 03/18/2015 01/19/15   Tishira R Brewington, PA-C   BP 140/68 mmHg  Pulse 93  Temp(Src) 97.9 F (36.6 C) (Oral)  Resp 23  SpO2 100% Physical Exam  Constitutional: She appears well-developed and well-nourished. No distress.  HENT:  Head: Normocephalic and atraumatic.  Eyes: Conjunctivae are normal. Right eye exhibits no discharge. Left eye exhibits no discharge.  Neck: Neck supple.  Cardiovascular: Normal rate, regular rhythm and normal heart sounds.  Exam reveals no gallop and no friction rub.   No murmur heard. Pulmonary/Chest: Effort normal and breath sounds normal. No respiratory distress. She exhibits no tenderness.  Abdominal: Soft. She exhibits no distension. There is no tenderness.  Musculoskeletal: She exhibits no edema or tenderness.  Lower extremities symmetric as compared to each other. No calf tenderness. Negative Homan's. No palpable cords.   Neurological: She is alert.  Skin: Skin is warm and dry.  Psychiatric: She has a normal mood and affect. Her behavior is normal. Thought content normal.  Nursing note and vitals reviewed.   ED Course  Procedures (including critical care time) Labs Review Labs Reviewed  CBC WITH DIFFERENTIAL/PLATELET  BASIC METABOLIC PANEL  TROPONIN I  TROPONIN I    Imaging Review No results found. I have personally reviewed and evaluated these images and lab results as part of my medical decision-making.   EKG Interpretation   Date/Time:  Thursday March 18 2015 16:07:14  EST Ventricular Rate:  68 PR Interval:  138 QRS Duration: 88 QT Interval:  419 QTC Calculation: 446 R Axis:   -8 Text Interpretation:  Sinus rhythm Borderline T abnormalities, anterior  and inferior leads No old tracing to compare Confirmed by Khamarion Bjelland  MD,  Elleanna Melling ([id number]) on 03/18/2015 4:17:27 PM      MDM   Final diagnoses:  Chest pain, unspecified chest pain type    54 rolled female with chest pain was seems atypical for ACS. Doubt PE, dissection, serious infection or other emergent pathology. Initial and repeat troponins normal. Chest x-ray without any acute abnormality. It has been determined that no acute conditions requiring further emergency intervention are present at this time. The patient has been advised of the diagnosis and plan. I reviewed any labs and imaging including any potential incidental findings. We have discussed signs and symptoms that warrant return to the ED and they are listed in the discharge instructions.      Raeford Razor, MD 03/22/15 [phone]

## 2015-05-17 ENCOUNTER — Emergency Department (HOSPITAL_COMMUNITY)
Admission: EM | Admit: 2015-05-17 | Discharge: 2015-05-17 | Discharge status: Against Medical Advice | Payer: BLUE CROSS/BLUE SHIELD

## 2015-05-17 NOTE — ED Notes (Signed)
Called pt from waiting rm to triage, no response

## 2015-06-16 ENCOUNTER — Emergency Department (HOSPITAL_COMMUNITY): Payer: Self-pay

## 2015-06-16 ENCOUNTER — Encounter (HOSPITAL_COMMUNITY): Payer: Self-pay

## 2015-06-16 ENCOUNTER — Emergency Department (HOSPITAL_COMMUNITY)
Admission: EM | Admit: 2015-06-16 | Discharge: 2015-06-16 | Disposition: A | Discharge status: Home / Self Care | Payer: Self-pay | Attending: Emergency Medicine | Admitting: Emergency Medicine

## 2015-06-16 DIAGNOSIS — Z79899 Other long term (current) drug therapy: Secondary | ICD-10-CM | POA: Insufficient documentation

## 2015-06-16 DIAGNOSIS — Z8719 Personal history of other diseases of the digestive system: Secondary | ICD-10-CM | POA: Insufficient documentation

## 2015-06-16 DIAGNOSIS — R51 Headache: Secondary | ICD-10-CM

## 2015-06-16 DIAGNOSIS — R1012 Left upper quadrant pain: Secondary | ICD-10-CM | POA: Insufficient documentation

## 2015-06-16 DIAGNOSIS — J3489 Other specified disorders of nose and nasal sinuses: Secondary | ICD-10-CM | POA: Insufficient documentation

## 2015-06-16 DIAGNOSIS — M25512 Pain in left shoulder: Secondary | ICD-10-CM | POA: Insufficient documentation

## 2015-06-16 DIAGNOSIS — Z9851 Tubal ligation status: Secondary | ICD-10-CM | POA: Insufficient documentation

## 2015-06-16 DIAGNOSIS — R519 Headache, unspecified: Secondary | ICD-10-CM

## 2015-06-16 DIAGNOSIS — G43909 Migraine, unspecified, not intractable, without status migrainosus: Secondary | ICD-10-CM | POA: Insufficient documentation

## 2015-06-16 DIAGNOSIS — Z90710 Acquired absence of both cervix and uterus: Secondary | ICD-10-CM | POA: Insufficient documentation

## 2015-06-16 DIAGNOSIS — I10 Essential (primary) hypertension: Secondary | ICD-10-CM | POA: Insufficient documentation

## 2015-06-16 LAB — URINALYSIS, ROUTINE W REFLEX MICROSCOPIC
BILIRUBIN URINE: NEGATIVE
GLUCOSE, UA: NEGATIVE mg/dL
HGB URINE DIPSTICK: NEGATIVE
KETONES UR: NEGATIVE mg/dL
Leukocytes, UA: NEGATIVE
Nitrite: NEGATIVE
PH: 5.5 (ref 5.0–8.0)
PROTEIN: NEGATIVE mg/dL
Specific Gravity, Urine: 1.025 (ref 1.005–1.030)

## 2015-06-16 LAB — CBC
HEMATOCRIT: 44.9 % (ref 36.0–46.0)
Hemoglobin: 15 g/dL (ref 12.0–15.0)
MCH: 29.9 pg (ref 26.0–34.0)
MCHC: 33.4 g/dL (ref 30.0–36.0)
MCV: 89.6 fL (ref 78.0–100.0)
PLATELETS: 264 10*3/uL (ref 150–400)
RBC: 5.01 MIL/uL (ref 3.87–5.11)
RDW: 13.1 % (ref 11.5–15.5)
WBC: 6.1 10*3/uL (ref 4.0–10.5)

## 2015-06-16 LAB — COMPREHENSIVE METABOLIC PANEL
ALT: 18 U/L (ref 14–54)
ANION GAP: 11 (ref 5–15)
AST: 20 U/L (ref 15–41)
Albumin: 4 g/dL (ref 3.5–5.0)
Alkaline Phosphatase: 103 U/L (ref 38–126)
BUN: 15 mg/dL (ref 6–20)
CHLORIDE: 105 mmol/L (ref 101–111)
CO2: 24 mmol/L (ref 22–32)
CREATININE: 0.98 mg/dL (ref 0.44–1.00)
Calcium: 9.2 mg/dL (ref 8.9–10.3)
Glucose, Bld: 91 mg/dL (ref 65–99)
POTASSIUM: 3.9 mmol/L (ref 3.5–5.1)
Sodium: 140 mmol/L (ref 135–145)
Total Bilirubin: 0.3 mg/dL (ref 0.3–1.2)
Total Protein: 8.1 g/dL (ref 6.5–8.1)

## 2015-06-16 LAB — I-STAT TROPONIN, ED: Troponin i, poc: 0 ng/mL (ref 0.00–0.08)

## 2015-06-16 LAB — LIPASE, BLOOD: LIPASE: 25 U/L (ref 11–51)

## 2015-06-16 MED ORDER — KETOROLAC TROMETHAMINE 60 MG/2ML IM SOLN: 60.0000 mg | Freq: Once | INTRAMUSCULAR | Status: DC

## 2015-06-16 MED ORDER — KETOROLAC TROMETHAMINE 30 MG/ML IJ SOLN
30.0000 mg | Freq: Once | INTRAMUSCULAR | Status: AC
  Administered 2015-06-16: 30 mg via INTRAVENOUS
  Filled 2015-06-16: qty 1

## 2015-06-16 MED ORDER — BUTALBITAL-ASA-CAFFEINE 50-325-40 MG PO CAPS: 1.0000 | ORAL_CAPSULE | Freq: Two times a day (BID) | ORAL | Status: AC | PRN

## 2015-06-16 MED ORDER — OXYCODONE-ACETAMINOPHEN 5-325 MG PO TABS
1.0000 | ORAL_TABLET | Freq: Once | ORAL | Status: AC
  Administered 2015-06-16: 1 via ORAL
  Filled 2015-06-16: qty 1

## 2015-06-16 MED ORDER — VERAPAMIL HCL ER 240 MG PO TBCR
240.0000 mg | EXTENDED_RELEASE_TABLET | Freq: Once | ORAL | Status: DC
Start: 2015-06-16 — End: 2015-06-16
  Filled 2015-06-16: qty 1

## 2015-06-16 MED ORDER — VERAPAMIL HCL ER 240 MG PO CP24: 240.0000 mg | ORAL_CAPSULE | Freq: Every day | ORAL | Status: AC

## 2015-06-16 NOTE — ED Provider Notes (Signed)
CSN: [id number]     Arrival date & time 06/16/15  1444 History   First MD Initiated Contact with Patient 06/16/15 1554     Chief Complaint  Patient presents with  . Abdominal Pain  . Hypertension  . Shoulder Pain     (Consider location/radiation/quality/duration/timing/severity/associated sxs/prior Treatment) HPI Comments: Patient with history of hypertension, migraine headaches presents with multiple complaints.  HA/right ear buzzing: Patient states that she has had an atypical headache across the entire head for the past week. She states it feels different than her normal migraine headaches. Unrelieved with OTC meds at home. She feels that it is related to her blood pressure. She states that she can feel a vibration in her right ear with her finger when the pain is worse. No hearing loss. Patient denies signs of stroke including: facial droop, slurred speech, aphasia, weakness/numbness in extremities, imbalance/trouble walking.  L shoulder pain: Patient with pain with movement in her left anterior and lateral shoulder for the past 2 days. Made worse with movement. No numbness or tingling in extremity. No additional chest pain or shortness of breath. Denies injuries. She has not had pain like this before.  Left upper abdominal pain: This is been ongoing for several months. She has been diagnosed with gallbladder sludge in the past. No associated fever, nausea, vomiting, diarrhea currently. No urinary symptoms.  Hypertension: Patient was previously on verapamil 240 mg daily. She states that she has been out of this for approximately one month. She has upcoming PCP appointment next month.   [name] presenting with abdominal pain, hypertension, and shoulder pain. The history is provided by the patient.  Abdominal Pain Hypertension Associated symptoms include abdominal pain.  Shoulder Pain   Past Medical History  Diagnosis Date  . Allergy   . Hypertension   .  Migraines    Past Surgical History  Procedure Laterality Date  . Abdominal hysterectomy    . Tubal ligation     Family History  Problem Relation Age of Onset  . Hypertension Mother    Social History  Substance Use Topics  . Smoking status: Never Smoker   . Smokeless tobacco: None  . Alcohol Use: 0.0 oz/week    0 Standard drinks or equivalent per week     Comment: occ   OB History    No data available     Review of Systems  Gastrointestinal: Positive for abdominal pain.  All other systems reviewed and are negative.     Allergies  Reglan; Compazine; and Contrast media  Home Medications   Prior to Admission medications   Medication Sig Start Date End Date Taking? Authorizing Provider  aspirin-acetaminophen-caffeine (EXCEDRIN MIGRAINE) [phone] MG tablet Take 1 tablet by mouth every 6 (six) hours as needed for headache.   Yes Historical Provider, MD  BIOTIN PO Take 1 tablet by mouth daily.   Yes Historical Provider, MD  diphenhydrAMINE (BENADRYL) 25 MG tablet Take 100 mg by mouth at bedtime as needed for sleep.   Yes Historical Provider, MD  famotidine (PEPCID) 10 MG tablet Take 10 mg by mouth 2 (two) times daily as needed for heartburn or indigestion.   Yes Historical Provider, MD  ipratropium (ATROVENT) 0.03 % nasal spray Place 2 sprays into both nostrils 2 (two) times daily. Patient taking differently: Place 2 sprays into both nostrils 2 (two) times daily as needed (allergies).  01/19/15  Yes Tishira R Brewington, PA-C  Multiple Vitamins-Minerals (MULTIVITAMIN WITH MINERALS) tablet Take 1  tablet by mouth daily.   Yes Historical Provider, MD  verapamil (VERELAN PM) 240 MG 24 hr capsule Take 1 capsule (240 mg total) by mouth once. Patient taking differently: Take 240 mg by mouth daily.  01/19/15  Yes Tishira R Brewington, PA-C  benzonatate (TESSALON) 100 MG capsule Take 1-2 capsules (100-200 mg total) by mouth 3 (three) times daily as needed for cough. Patient not  taking: Reported on 03/18/2015 01/19/15   Tishira R Brewington, PA-C  chlorpheniramine-HYDROcodone (TUSSIONEX PENNKINETIC ER) 10-8 MG/5ML SUER Take 5 mLs by mouth at bedtime as needed for cough. Patient not taking: Reported on 03/18/2015 01/19/15   Tishira R Brewington, PA-C   BP 200/100 mmHg  Pulse 97  Temp(Src) 100.2 F (37.9 C) (Oral)  Resp 18  SpO2 97%   Physical Exam  Constitutional: She is oriented to person, place, and time. She appears well-developed and well-nourished.  HENT:  Head: Normocephalic and atraumatic.  Right Ear: Tympanic membrane, external ear and ear canal normal.  Left Ear: Tympanic membrane, external ear and ear canal normal.  Nose: Rhinorrhea present.  Mouth/Throat: Uvula is midline, oropharynx is clear and moist and mucous membranes are normal.  Eyes: Conjunctivae, EOM and lids are normal. Pupils are equal, round, and reactive to light. Right eye exhibits no discharge. Left eye exhibits no discharge. Right eye exhibits no nystagmus. Left eye exhibits no nystagmus.  Neck: Normal range of motion. Neck supple.  Cardiovascular: Normal rate, regular rhythm and normal heart sounds.   No murmur heard. Pulmonary/Chest: Effort normal and breath sounds normal. No respiratory distress. She has no wheezes. She has no rales.  Abdominal: Soft. She exhibits no distension. There is tenderness (LUQ mild). There is no rebound and no guarding.  Musculoskeletal: She exhibits tenderness. She exhibits no edema.       Right shoulder: Normal.       Left shoulder: She exhibits decreased range of motion (patient cannot abduct past 90 degrees 2/2 pain), tenderness (anterior/lateral to light and deep palp of musculature. ) and pain. She exhibits no bony tenderness, no swelling, no effusion, no deformity, no spasm and normal strength.       Cervical back: She exhibits normal range of motion, no tenderness and no bony tenderness.       Thoracic back: Normal.  Neurological: She is alert and  oriented to person, place, and time. She has normal strength and normal reflexes. No cranial nerve deficit or sensory deficit. She displays a negative Romberg sign. Coordination and gait normal. GCS eye subscore is 4. GCS verbal subscore is 5. GCS motor subscore is 6.  Skin: Skin is warm and dry.  Psychiatric: She has a normal mood and affect.  Nursing note and vitals reviewed.   ED Course  Procedures (including critical care time) Labs Review Labs Reviewed  LIPASE, BLOOD  COMPREHENSIVE METABOLIC PANEL  CBC  URINALYSIS, ROUTINE W REFLEX MICROSCOPIC (NOT AT [facility])  I-STAT TROPOININ, ED    Imaging Review Ct Head Wo Contrast  06/16/2015  CLINICAL DATA:  Buzzing in LEFT ear, LEFT shoulder and arm pain for 2 days, headache for 1 week, ran out of her medication for hypertension 1 month ago EXAM: CT HEAD WITHOUT CONTRAST TECHNIQUE: Contiguous axial images were obtained from the base of the skull through the vertex without intravenous contrast. COMPARISON:  None FINDINGS: Normal ventricular morphology. No midline shift or mass effect. Normal appearance of brain parenchyma. No intracranial hemorrhage, mass lesion, or acute infarction. Visualized paranasal sinuses and mastoid air  cells clear. Bones unremarkable. IMPRESSION: Normal exam. Electronically Signed   By: Ulyses Southward M.D.   On: 06/16/2015 17:06   Dg Shoulder Left  06/16/2015  CLINICAL DATA:  Left shoulder pain for 2 days, no injury EXAM: LEFT SHOULDER - 2+ VIEW COMPARISON:  None FINDINGS: The left humeral head is in normal position and the glenohumeral joint space appears normal. The left AC joint is normally aligned. The sub acromial joint space appears normal. The ribs that are visualized are intact. IMPRESSION: Negative. Electronically Signed   By: Dwyane Dee M.D.   On: 06/16/2015 17:17   I have personally reviewed and evaluated these images and lab results as part of my medical decision-making.   EKG Interpretation   Date/Time:   Wednesday June 16 2015 15:36:23 EST Ventricular Rate:  91 PR Interval:  143 QRS Duration: 84 QT Interval:  342 QTC Calculation: 421 R Axis:   -16 Text Interpretation:  Sinus rhythm Borderline left axis deviation Low  voltage, precordial leads RSR' in V1 or V2, probably normal variant  Confirmed by Lincoln Brigham [id number]) on 06/16/2015 4:07:21 PM       4:10 PM Patient seen and examined. EKG reviewed. L shoulder pain is readily reproducible and is not likely ACS. Work-up initiated. Medications ordered.   Vital signs reviewed and are as follows: BP 200/100 mmHg  Pulse 97  Temp(Src) 100.2 F (37.9 C) (Oral)  Resp 18  SpO2 97%  6:44 PM Work-up is neg, discussed with patient. BP improved without treatment here. Recheck of temperature is 99.73F. Patient voiced frustration that she was only given Toradol for her headache. She is requesting IV dilaudid or morphine stating that she needs this when her headache is this bad. We discussed appropriate treatments for headaches, I offered PO percocet for her shoulder pain prior to discharge and she accepts. She states that she would like to go home.   Will also provide 1 month RX of verapamil.   The patient was urged to return to the Emergency Department immediately with worsening of current symptoms, worsening abdominal pain, persistent vomiting, blood noted in stools, fever, or any other concerns. The patient verbalized understanding.   Patient was counseled on head injury precautions and symptoms that should indicate their return to the ED.  These include severe worsening headache, vision changes, confusion, loss of consciousness, trouble walking, nausea & vomiting, or weakness/tingling in extremities.    BP 162/92 mmHg  Pulse 96  Temp(Src) 99.7 F (37.6 C) (Oral)  Resp 20  SpO2 100%   MDM   Final diagnoses:  Left upper quadrant pain  Acute nonintractable headache, unspecified headache type  Left shoulder pain  Essential hypertension   LUQ  pain: This is acute on chronic, mild tenderness on palpation. No fever, N/V/D. Normal LFTs and lipase. Normal hemoglobin. This can further be evaluated by PCP.  Headache: Head CT neg. No temporal artery tenderness. Patient without high-risk features of headache including: sudden onset/thunderclap HA, altered mental status, accompanying seizure, headache with exertion, history of immunocompromise, neck or shoulder pain, fever, use of anticoagulation, family history of spontaneous SAH, concomitant drug use, toxic exposure.   Patient has a normal complete neurological exam, normal vital signs, normal level of consciousness, no signs of meningismus, is well-appearing/non-toxic appearing, no signs of trauma.   Shoulder pain: Imaging negative. Slightly decreased ROM. No elevated WBC, exam is not really suggestive of septic arthritis. Ortho referral given. Tylenol for pain.   HTN: Improved spontaneously. No other evidence of  end organ damage. Will restart on hypertension meds.  No dangerous or life-threatening conditions suspected or identified by history, physical exam, and by work-up. No indications for hospitalization identified.      Renne Crigler, PA-C 06/16/15 1910  Tilden Fossa, MD 06/18/15 [phone]

## 2015-06-16 NOTE — Progress Notes (Signed)
Patient noted to have been seen in the Ed 3 times within the last six months.  EDCM spoke to patient at bedside.  Patient reports she has [employer] insurance and [facility].  Patient reports she has a doctor appointment to establish care on April 17.  She reports when she was at urgent care she was referred to this new doctor but she cannot remember the name of the doctor she was referred to.  She would like a sooner appointment than April 17.  EDCM provided patient a list of pcps who accept [employer] insurance within a 5 mile radius of her zip code.  Patient thankful for resources.  No further EDCm needs at this time.

## 2015-06-16 NOTE — ED Notes (Signed)
Patient asking for prescription for migraine medication prior to leaving, will ask PA

## 2015-06-16 NOTE — ED Notes (Signed)
MADE TWO UNSUCCESSFUL TRIES TO COLLECT BLOOD SAMPLES

## 2015-06-16 NOTE — ED Notes (Signed)
Pain medication given to patient, instructed not to drive home after taking medication.

## 2015-06-16 NOTE — Discharge Instructions (Signed)
Please read and follow all provided instructions.  Your diagnoses today include:  1. Left upper quadrant pain   2. Acute nonintractable headache, unspecified headache type   3. Left shoulder pain   4. Essential hypertension    Tests performed today include:  Blood counts and electrolytes  Blood tests to check liver and kidney function  Blood tests to check pancreas function  Urine test to look for infection - normal  Blood test for heart attack - negative  Vital signs. See below for your results today.   Medications prescribed:   Verapamil   Take any prescribed medications only as directed.  Home care instructions:   Follow any educational materials contained in this packet.  Follow-up instructions: Please follow-up with your primary care provider in the next 7 days for further evaluation of your symptoms.    Return instructions:  SEEK IMMEDIATE MEDICAL ATTENTION IF:  The pain does not go away or becomes severe   A temperature above 101F develops   Repeated vomiting occurs (multiple episodes)   The pain becomes localized to portions of the abdomen. The right side could possibly be appendicitis. In an adult, the left lower portion of the abdomen could be colitis or diverticulitis.   Blood is being passed in stools or vomit (bright red or black tarry stools)   You develop chest pain, difficulty breathing, dizziness or fainting, or become confused, poorly responsive, or inconsolable (young children)  If you have any other emergent concerns regarding your health  Additional Information: Abdominal (belly) pain can be caused by many things. Your caregiver performed an examination and possibly ordered blood/urine tests and imaging (CT scan, x-rays, ultrasound). Many cases can be observed and treated at home after initial evaluation in the emergency department. Even though you are being discharged home, abdominal pain can be unpredictable. Therefore, you need a repeated  exam if your pain does not resolve, returns, or worsens. Most patients with abdominal pain don't have to be admitted to the hospital or have surgery, but serious problems like appendicitis and gallbladder attacks can start out as nonspecific pain. Many abdominal conditions cannot be diagnosed in one visit, so follow-up evaluations are very important.  Your vital signs today were: BP 162/92 mmHg   Pulse 96   Temp(Src) 99.7 F (37.6 C) (Oral)   Resp 20   SpO2 100% If your blood pressure (bp) was elevated above 135/85 this visit, please have this repeated by your doctor within one month. --------------

## 2015-06-16 NOTE — ED Notes (Signed)
Pt c/o "buzzing" in L ear, LUQ abdominal pain, nausea, and L shoulder/arm pain x 2 days and headache x 1 week.  Pain score 7/10.  Pt reports that she ran out of HTN medication x 1 month ago.  Sts she is new to the area and cannot get into new MD until mid-April.  Pt reports she was previously diagnosed w/ "sludge."

## 2015-08-29 ENCOUNTER — Inpatient Hospital Stay (HOSPITAL_COMMUNITY)
Admission: EM | Admit: 2015-08-29 | Discharge: 2015-09-09 | DRG: 917 | Disposition: E | Payer: Managed Care, Other (non HMO) | Attending: Pulmonary Disease | Admitting: Pulmonary Disease

## 2015-08-29 ENCOUNTER — Encounter (HOSPITAL_COMMUNITY): Payer: Self-pay | Admitting: Emergency Medicine

## 2015-08-29 ENCOUNTER — Emergency Department (HOSPITAL_COMMUNITY): Payer: Managed Care, Other (non HMO)

## 2015-08-29 DIAGNOSIS — G9341 Metabolic encephalopathy: Secondary | ICD-10-CM | POA: Diagnosis present

## 2015-08-29 DIAGNOSIS — F329 Major depressive disorder, single episode, unspecified: Secondary | ICD-10-CM | POA: Diagnosis present

## 2015-08-29 DIAGNOSIS — I442 Atrioventricular block, complete: Secondary | ICD-10-CM | POA: Diagnosis present

## 2015-08-29 DIAGNOSIS — T461X2A Poisoning by calcium-channel blockers, intentional self-harm, initial encounter: Secondary | ICD-10-CM | POA: Diagnosis present

## 2015-08-29 DIAGNOSIS — N179 Acute kidney failure, unspecified: Secondary | ICD-10-CM | POA: Diagnosis present

## 2015-08-29 DIAGNOSIS — T43222A Poisoning by selective serotonin reuptake inhibitors, intentional self-harm, initial encounter: Secondary | ICD-10-CM | POA: Diagnosis present

## 2015-08-29 DIAGNOSIS — T50902A Poisoning by unspecified drugs, medicaments and biological substances, intentional self-harm, initial encounter: Secondary | ICD-10-CM

## 2015-08-29 DIAGNOSIS — J9600 Acute respiratory failure, unspecified whether with hypoxia or hypercapnia: Secondary | ICD-10-CM

## 2015-08-29 DIAGNOSIS — J690 Pneumonitis due to inhalation of food and vomit: Secondary | ICD-10-CM | POA: Diagnosis present

## 2015-08-29 DIAGNOSIS — R4020 Unspecified coma: Secondary | ICD-10-CM | POA: Diagnosis present

## 2015-08-29 DIAGNOSIS — J80 Acute respiratory distress syndrome: Secondary | ICD-10-CM | POA: Diagnosis present

## 2015-08-29 DIAGNOSIS — I4589 Other specified conduction disorders: Secondary | ICD-10-CM | POA: Diagnosis present

## 2015-08-29 DIAGNOSIS — T1491 Suicide attempt: Secondary | ICD-10-CM

## 2015-08-29 DIAGNOSIS — I952 Hypotension due to drugs: Secondary | ICD-10-CM | POA: Diagnosis present

## 2015-08-29 DIAGNOSIS — A419 Sepsis, unspecified organism: Secondary | ICD-10-CM | POA: Diagnosis not present

## 2015-08-29 DIAGNOSIS — Z4659 Encounter for fitting and adjustment of other gastrointestinal appliance and device: Secondary | ICD-10-CM

## 2015-08-29 DIAGNOSIS — Z978 Presence of other specified devices: Secondary | ICD-10-CM

## 2015-08-29 DIAGNOSIS — E875 Hyperkalemia: Secondary | ICD-10-CM | POA: Diagnosis present

## 2015-08-29 DIAGNOSIS — T424X2A Poisoning by benzodiazepines, intentional self-harm, initial encounter: Principal | ICD-10-CM | POA: Diagnosis present

## 2015-08-29 DIAGNOSIS — R0600 Dyspnea, unspecified: Secondary | ICD-10-CM | POA: Diagnosis not present

## 2015-08-29 DIAGNOSIS — R6521 Severe sepsis with septic shock: Secondary | ICD-10-CM | POA: Diagnosis not present

## 2015-08-29 DIAGNOSIS — R001 Bradycardia, unspecified: Secondary | ICD-10-CM

## 2015-08-29 DIAGNOSIS — F419 Anxiety disorder, unspecified: Secondary | ICD-10-CM | POA: Diagnosis present

## 2015-08-29 DIAGNOSIS — J988 Other specified respiratory disorders: Secondary | ICD-10-CM

## 2015-08-29 DIAGNOSIS — E876 Hypokalemia: Secondary | ICD-10-CM | POA: Diagnosis present

## 2015-08-29 DIAGNOSIS — I10 Essential (primary) hypertension: Secondary | ICD-10-CM | POA: Diagnosis present

## 2015-08-29 DIAGNOSIS — I469 Cardiac arrest, cause unspecified: Secondary | ICD-10-CM | POA: Diagnosis not present

## 2015-08-29 DIAGNOSIS — E861 Hypovolemia: Secondary | ICD-10-CM | POA: Diagnosis present

## 2015-08-29 DIAGNOSIS — Z8249 Family history of ischemic heart disease and other diseases of the circulatory system: Secondary | ICD-10-CM | POA: Diagnosis not present

## 2015-08-29 DIAGNOSIS — R739 Hyperglycemia, unspecified: Secondary | ICD-10-CM | POA: Diagnosis present

## 2015-08-29 DIAGNOSIS — T1491XA Suicide attempt, initial encounter: Secondary | ICD-10-CM | POA: Diagnosis present

## 2015-08-29 DIAGNOSIS — Y92481 Parking lot as the place of occurrence of the external cause: Secondary | ICD-10-CM

## 2015-08-29 DIAGNOSIS — R4182 Altered mental status, unspecified: Secondary | ICD-10-CM | POA: Diagnosis present

## 2015-08-29 DIAGNOSIS — Z6841 Body Mass Index (BMI) 40.0 and over, adult: Secondary | ICD-10-CM

## 2015-08-29 DIAGNOSIS — E872 Acidosis: Secondary | ICD-10-CM | POA: Diagnosis present

## 2015-08-29 DIAGNOSIS — J9690 Respiratory failure, unspecified, unspecified whether with hypoxia or hypercapnia: Secondary | ICD-10-CM

## 2015-08-29 DIAGNOSIS — G9340 Encephalopathy, unspecified: Secondary | ICD-10-CM

## 2015-08-29 DIAGNOSIS — Z452 Encounter for adjustment and management of vascular access device: Secondary | ICD-10-CM

## 2015-08-29 HISTORY — DX: Reserved for inherently not codable concepts without codable children: IMO0001

## 2015-08-29 HISTORY — DX: Other specified health status: Z78.9

## 2015-08-29 LAB — BLOOD GAS, ARTERIAL
Acid-base deficit: 8.2 mmol/L — ABNORMAL HIGH (ref 0.0–2.0)
Bicarbonate: 17.8 mEq/L — ABNORMAL LOW (ref 20.0–24.0)
DRAWN BY: 270211
FIO2: 1
MECHVT: 530 mL
O2 Saturation: 98.7 %
PEEP: 5 cmH2O
PO2 ART: 162 mmHg — AB (ref 80.0–100.0)
Patient temperature: 37
RATE: 20 resp/min
TCO2: 16.6 mmol/L (ref 0–100)
pCO2 arterial: 40.6 mmHg (ref 35.0–45.0)
pH, Arterial: 7.265 — ABNORMAL LOW (ref 7.350–7.450)

## 2015-08-29 LAB — COMPREHENSIVE METABOLIC PANEL
ALT: 20 U/L (ref 14–54)
AST: 22 U/L (ref 15–41)
Albumin: 3.5 g/dL (ref 3.5–5.0)
Alkaline Phosphatase: 98 U/L (ref 38–126)
Anion gap: 8 (ref 5–15)
BILIRUBIN TOTAL: 0.3 mg/dL (ref 0.3–1.2)
BUN: 13 mg/dL (ref 6–20)
CHLORIDE: 107 mmol/L (ref 101–111)
CO2: 22 mmol/L (ref 22–32)
CREATININE: 0.94 mg/dL (ref 0.44–1.00)
Calcium: 9.2 mg/dL (ref 8.9–10.3)
Glucose, Bld: 164 mg/dL — ABNORMAL HIGH (ref 65–99)
POTASSIUM: 4.2 mmol/L (ref 3.5–5.1)
Sodium: 137 mmol/L (ref 135–145)
TOTAL PROTEIN: 7.7 g/dL (ref 6.5–8.1)

## 2015-08-29 LAB — I-STAT CHEM 8, ED
BUN: 16 mg/dL (ref 6–20)
CALCIUM ION: 1.14 mmol/L (ref 1.12–1.23)
CHLORIDE: 107 mmol/L (ref 101–111)
Creatinine, Ser: 0.8 mg/dL (ref 0.44–1.00)
Glucose, Bld: 163 mg/dL — ABNORMAL HIGH (ref 65–99)
HEMATOCRIT: 45 % (ref 36.0–46.0)
Hemoglobin: 15.3 g/dL — ABNORMAL HIGH (ref 12.0–15.0)
Potassium: 4.4 mmol/L (ref 3.5–5.1)
SODIUM: 141 mmol/L (ref 135–145)
TCO2: 25 mmol/L (ref 0–100)

## 2015-08-29 LAB — GLUCOSE, CAPILLARY
GLUCOSE-CAPILLARY: 302 mg/dL — AB (ref 65–99)
GLUCOSE-CAPILLARY: 315 mg/dL — AB (ref 65–99)
GLUCOSE-CAPILLARY: 334 mg/dL — AB (ref 65–99)
GLUCOSE-CAPILLARY: 335 mg/dL — AB (ref 65–99)
GLUCOSE-CAPILLARY: 372 mg/dL — AB (ref 65–99)
Glucose-Capillary: 354 mg/dL — ABNORMAL HIGH (ref 65–99)
Glucose-Capillary: 338 mg/dL — ABNORMAL HIGH (ref 65–99)

## 2015-08-29 LAB — CBG MONITORING, ED: GLUCOSE-CAPILLARY: 312 mg/dL — AB (ref 65–99)

## 2015-08-29 LAB — CBC
HCT: 41.3 % (ref 36.0–46.0)
Hemoglobin: 13.8 g/dL (ref 12.0–15.0)
MCH: 29 pg (ref 26.0–34.0)
MCHC: 33.4 g/dL (ref 30.0–36.0)
MCV: 86.8 fL (ref 78.0–100.0)
PLATELETS: 299 10*3/uL (ref 150–400)
RBC: 4.76 MIL/uL (ref 3.87–5.11)
RDW: 13.4 % (ref 11.5–15.5)
WBC: 10.4 10*3/uL (ref 4.0–10.5)

## 2015-08-29 LAB — ETHANOL

## 2015-08-29 LAB — MRSA PCR SCREENING: MRSA by PCR: NEGATIVE

## 2015-08-29 LAB — SALICYLATE LEVEL

## 2015-08-29 LAB — ACETAMINOPHEN LEVEL

## 2015-08-29 MED ORDER — SODIUM CHLORIDE 0.9 % IV BOLUS (SEPSIS)
1000.0000 mL | Freq: Once | INTRAVENOUS | Status: AC
  Administered 2015-08-29: 1000 mL via INTRAVENOUS

## 2015-08-29 MED ORDER — SODIUM CHLORIDE 0.9 % IV SOLN
INTRAVENOUS | Status: DC
  Administered 2015-08-29: 76.5 [IU]/h via INTRAVENOUS
  Administered 2015-08-30: 101 [IU]/h via INTRAVENOUS
  Administered 2015-08-30: 84 [IU]/h via INTRAVENOUS
  Administered 2015-08-30: 08:00:00 via INTRAVENOUS
  Filled 2015-08-29 (×5): qty 2.5

## 2015-08-29 MED ORDER — INSULIN ASPART 100 UNIT/ML ~~LOC~~ SOLN
0.0000 [IU] | SUBCUTANEOUS | Status: DC
  Filled 2015-08-29: qty 1

## 2015-08-29 MED ORDER — DEXTROSE 50 % IV SOLN
INTRAVENOUS | Status: AC
  Filled 2015-08-29: qty 100

## 2015-08-29 MED ORDER — PHENYLEPHRINE 200 MCG/ML FOR PRIAPISM / HYPOTENSION
100.0000 ug | Freq: Once | INTRAMUSCULAR | Status: AC
  Administered 2015-08-29: 100 ug via INTRAVENOUS

## 2015-08-29 MED ORDER — GI COCKTAIL ~~LOC~~
30.0000 mL | Freq: Once | ORAL | Status: DC
Start: 2015-08-29 — End: 2015-08-29

## 2015-08-29 MED ORDER — PHENYLEPHRINE 200 MCG/ML FOR PRIAPISM / HYPOTENSION
INTRAMUSCULAR | Status: AC
  Filled 2015-08-29: qty 50

## 2015-08-29 MED ORDER — SODIUM CHLORIDE 0.9 % IV BOLUS (SEPSIS): 1000.0000 mL | Freq: Once | INTRAVENOUS | Status: DC

## 2015-08-29 MED ORDER — ROCURONIUM BROMIDE 50 MG/5ML IV SOLN
INTRAVENOUS | Status: AC | PRN
  Administered 2015-08-29: 100 mg via INTRAVENOUS

## 2015-08-29 MED ORDER — SODIUM CHLORIDE 0.9 % IV SOLN
1.0000 g | Freq: Once | INTRAVENOUS | Status: DC
  Filled 2015-08-29: qty 10

## 2015-08-29 MED ORDER — PROPOFOL 1000 MG/100ML IV EMUL: 0.0000 ug/kg/min | INTRAVENOUS | Status: DC

## 2015-08-29 MED ORDER — FENTANYL CITRATE (PF) 100 MCG/2ML IJ SOLN
100.0000 ug | INTRAMUSCULAR | Status: DC | PRN
  Filled 2015-08-29 (×2): qty 2

## 2015-08-29 MED ORDER — FENTANYL CITRATE (PF) 100 MCG/2ML IJ SOLN: 100.0000 ug | INTRAMUSCULAR | Status: DC | PRN

## 2015-08-29 MED ORDER — ENOXAPARIN SODIUM 80 MG/0.8ML ~~LOC~~ SOLN
0.5000 mg/kg | SUBCUTANEOUS | Status: DC
  Administered 2015-08-29: 70 mg via SUBCUTANEOUS
  Filled 2015-08-29 (×2): qty 0.8

## 2015-08-29 MED ORDER — GLUCAGON HCL RDNA (DIAGNOSTIC) 1 MG IJ SOLR
5.0000 mg | Freq: Once | INTRAVENOUS | Status: AC
  Administered 2015-08-29: 5 mg via INTRAVENOUS
  Filled 2015-08-29: qty 5

## 2015-08-29 MED ORDER — FAMOTIDINE IN NACL 20-0.9 MG/50ML-% IV SOLN: 20.0000 mg | Freq: Once | INTRAVENOUS | Status: DC

## 2015-08-29 MED ORDER — MIDAZOLAM HCL 2 MG/2ML IJ SOLN
2.0000 mg | INTRAMUSCULAR | Status: DC | PRN
  Administered 2015-08-30 – 2015-08-31 (×3): 2 mg via INTRAVENOUS
  Filled 2015-08-29 (×3): qty 2

## 2015-08-29 MED ORDER — SODIUM CHLORIDE 0.9 % IV SOLN
INTRAVENOUS | Status: DC
  Filled 2015-08-29: qty 2.5

## 2015-08-29 MED ORDER — SODIUM CHLORIDE 0.9 % IV SOLN
INTRAVENOUS | Status: DC
  Administered 2015-08-29 – 2015-08-30 (×5): via INTRAVENOUS

## 2015-08-29 MED ORDER — SODIUM CHLORIDE 0.9 % IV SOLN
1.0000 g | Freq: Once | INTRAVENOUS | Status: AC
  Administered 2015-08-29: 1 g via INTRAVENOUS
  Filled 2015-08-29: qty 10

## 2015-08-29 MED ORDER — GLUCAGON HCL RDNA (DIAGNOSTIC) 1 MG IJ SOLR
3.0000 mg/h | INTRAVENOUS | Status: DC
  Administered 2015-08-29 – 2015-08-30 (×6): 3 mg/h via INTRAVENOUS
  Filled 2015-08-29: qty 5
  Filled 2015-08-29 (×2): qty 10
  Filled 2015-08-29: qty 5
  Filled 2015-08-29 (×2): qty 10

## 2015-08-29 MED ORDER — DEXTROSE 5 % IV SOLN
0.0000 ug/min | INTRAVENOUS | Status: DC
  Administered 2015-08-29: 50 ug/min via INTRAVENOUS
  Administered 2015-08-30: 35 ug/min via INTRAVENOUS
  Administered 2015-08-30: 20 ug/min via INTRAVENOUS
  Filled 2015-08-29 (×5): qty 1

## 2015-08-29 MED ORDER — CHLORHEXIDINE GLUCONATE 0.12% ORAL RINSE (MEDLINE KIT)
15.0000 mL | Freq: Two times a day (BID) | OROMUCOSAL | Status: DC
  Administered 2015-08-29 – 2015-08-31 (×4): 15 mL via OROMUCOSAL

## 2015-08-29 MED ORDER — ONDANSETRON HCL 4 MG/2ML IJ SOLN
4.0000 mg | Freq: Once | INTRAMUSCULAR | Status: AC
  Administered 2015-08-29: 4 mg via INTRAVENOUS
  Filled 2015-08-29: qty 2

## 2015-08-29 MED ORDER — ETOMIDATE 2 MG/ML IV SOLN
INTRAVENOUS | Status: AC | PRN
  Administered 2015-08-29: 30 mg via INTRAVENOUS

## 2015-08-29 MED ORDER — PANTOPRAZOLE SODIUM 40 MG IV SOLR
40.0000 mg | Freq: Every day | INTRAVENOUS | Status: DC
  Administered 2015-08-29: 40 mg via INTRAVENOUS
  Filled 2015-08-29: qty 40

## 2015-08-29 MED ORDER — ANTISEPTIC ORAL RINSE SOLUTION (CORINZ)
7.0000 mL | Freq: Four times a day (QID) | OROMUCOSAL | Status: DC
  Administered 2015-08-30 – 2015-08-31 (×7): 7 mL via OROMUCOSAL

## 2015-08-29 MED ORDER — CALCIUM CHLORIDE 10 % IV SOLN: 1.0000 g | Freq: Once | INTRAVENOUS | Status: DC

## 2015-08-29 MED ORDER — GLUCAGON HCL RDNA (DIAGNOSTIC) 1 MG IJ SOLR
INTRAMUSCULAR | Status: AC
  Administered 2015-08-29: 1 mg
  Filled 2015-08-29: qty 1

## 2015-08-29 MED ORDER — INSULIN ASPART 100 UNIT/ML ~~LOC~~ SOLN
100.0000 [IU] | Freq: Once | SUBCUTANEOUS | Status: AC
Start: 2015-08-29 — End: 2015-08-29
  Administered 2015-08-29: 100 [IU] via INTRAVENOUS

## 2015-08-29 MED ORDER — DEXTROSE 50 % IV SOLN
1.0000 | Freq: Once | INTRAVENOUS | Status: AC
  Administered 2015-08-29: 50 mL via INTRAVENOUS
  Filled 2015-08-29: qty 50

## 2015-08-29 MED ORDER — ONDANSETRON HCL 4 MG/2ML IJ SOLN: 4.0000 mg | Freq: Once | INTRAMUSCULAR | Status: DC

## 2015-08-29 NOTE — ED Notes (Signed)
Chest xray at bedside.

## 2015-08-29 NOTE — Progress Notes (Signed)
ANTICOAGULATION CONSULT NOTE - Initial Consult  Pharmacy Consult for enoxaparin Indication: VTE prophylaxis  Allergies  Allergen Reactions  . 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 Compazine [Prochlorperazine Edisylate] Anxiety  . Contrast Media [Iodinated Diagnostic Agents] Rash and Other (See Comments)    Dye for the CT contrast, causes heaviness in her chest    Patient Measurements: Height: 5\' 9"  (175.3 cm) Weight: (!) 318 lb (144.244 kg) IBW/kg (Calculated) : 66.2 Heparin Dosing Weight:   Vital Signs: Temp: 97.6 F (36.4 C) (05/21 1435) Temp Source: Oral (05/21 1435) BP: 78/54 mmHg (05/21 1728) Pulse Rate: 51 (05/21 1715)  Labs:  Recent Labs  08/16/2015 1451 08/22/2015 1504  HGB 13.8 15.3*  HCT 41.3 45.0  PLT 299  --   CREATININE 0.94 0.80    Estimated Creatinine Clearance: 120.7 mL/min (by C-G formula based on Cr of 0.8).   Medical History: Past Medical History  Diagnosis Date  . Allergy   . Hypertension   . Migraines     Medications:   (Not in a hospital admission)  Assessment: Pharmacy is consulted to dose enoxaparin for VTE prophylaxis in 55 yo female diagnosed with intentional overdose of verapamil, Xanax, melatonin and Zoloft. Pt has been intubated. Hgb, plt WNL on admission. CrCl 88 ml/min (N).  Goal of Therapy:  Anti-Xa level 0.6-1 units/ml 4hrs after LMWH dose given Monitor platelets by anticoagulation protocol: Yes   Plan:  Lovenox 70 mg Pablo Pena q24h   Adalberto ColeNikola Alon Mazor, PharmD, BCPS Pager [phone] 08/21/2015 5:49 PM

## 2015-08-29 NOTE — H&P (Signed)
PULMONARY / CRITICAL CARE MEDICINE   Name: Kimberly Mcintyre MRN: [id number] DOB: 1960/04/21    ADMISSION DATE:  08/24/2015  REFERRING MD:  Dr. Rubin Payor  CHIEF COMPLAINT:  Altered mental status.  HISTORY OF PRESENT ILLNESS:   Hx from chart, ER staff.  [place] parking [place].  Told EMS that she took 212 mg verapamil, 240 mg xanax, zoloft, and whole bottle of melatonin.  She told EMS she can't sleep, and was trying to take medicine to help sleep.  She later said she wanted to kill herself because she had too much stress taking care of her mother.  Her mental status became progressively worse, and she required intubation for airway protection in the ER.  She had bradycardia.  She was started on glucagon with improvement in heart rhythm/rate and blood pressure.  PAST MEDICAL HISTORY :  She  has a past medical history of Allergy; Hypertension; and Migraines.  PAST SURGICAL HISTORY: She  has past surgical history that includes Abdominal hysterectomy and Tubal ligation.  Allergies  Allergen Reactions  . 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 Compazine [Prochlorperazine Edisylate] Anxiety  . Contrast Media [Iodinated Diagnostic Agents] Rash and Other (See Comments)    Dye for the CT contrast, causes heaviness in her chest    No current facility-administered medications on file prior to encounter.   Current Outpatient Prescriptions on File Prior to Encounter  Medication Sig  . aspirin-acetaminophen-caffeine (EXCEDRIN MIGRAINE) 250-250-65 MG tablet Take 1 tablet by mouth every 6 (six) hours as needed for headache.  Marland Kitchen BIOTIN PO Take 1 tablet by mouth daily.  . butalbital-aspirin-caffeine (FIORINAL) 50-325-40 MG capsule Take 1 capsule by mouth 2 (two) times daily as needed for headache.  . diphenhydrAMINE (BENADRYL) 25 MG tablet Take 100 mg by mouth at bedtime as needed for sleep.  . famotidine (PEPCID) 10 MG tablet Take 10 mg by mouth 2 (two) times daily  as needed for heartburn or indigestion.  . Multiple Vitamins-Minerals (MULTIVITAMIN WITH MINERALS) tablet Take 1 tablet by mouth daily.  . verapamil (VERELAN PM) 240 MG 24 hr capsule Take 1 capsule (240 mg total) by mouth daily.  . [DISCONTINUED] ipratropium (ATROVENT) 0.03 % nasal spray Place 2 sprays into both nostrils 2 (two) times daily. (Patient taking differently: Place 2 sprays into both nostrils 2 (two) times daily as needed (allergies). )    FAMILY HISTORY:  Unable to obtain.  SOCIAL HISTORY: She  reports that she has never smoked. She does not have any smokeless tobacco history on file. She reports that she drinks alcohol. She reports that she does not use illicit drugs.  REVIEW OF SYSTEMS:   Unable to obtain.  SUBJECTIVE:   VITAL SIGNS: BP 100/64 mmHg  Pulse 57  Temp(Src) 97.6 F (36.4 C) (Oral)  Resp 20  Ht  (1.753 m)  SpO2 100%  HEMODYNAMICS:    VENTILATOR SETTINGS: Vent Mode:  [-] PRVC FiO2 (%):  [100 %] 100 % Set Rate:  [20 bmp] 20 bmp Vt Set:  [530 mL] 530 mL PEEP:  [5 cmH20] 5 cmH20 Plateau Pressure:  [27 cmH20] 27 cmH20  INTAKE / OUTPUT:    PHYSICAL EXAMINATION: General:  Seen in ER Neuro:  comatose HEENT:  Pupils pinpoint, ETT in place, increased saliva Cardiovascular:  Bradycardic, regular, no murmur Lungs:  No wheeze/rales Abdomen:  Soft, non tender, decreased BS Musculoskeletal:  No edema Skin:  No rashes  LABS:  BMET  Recent Labs Lab 08/21/2015 1451 09/04/2015 1504  NA 137 141  K 4.2 4.4  CL 107 107  CO2 22  --   BUN 13 16  CREATININE 0.94 0.80  GLUCOSE 164* 163*    Electrolytes  Recent Labs Lab 09/02/2015 1451  CALCIUM 9.2    CBC  Recent Labs Lab 09/05/2015 1451 09/01/2015 1504  WBC 10.4  --   HGB 13.8 15.3*  HCT 41.3 45.0  PLT 299  --     Coag's No results for input(s): APTT, INR in the last 168 hours.  Sepsis Markers No results for input(s): LATICACIDVEN, PROCALCITON, O2SATVEN in the last 168  hours.  ABG No results for input(s): PHART, PCO2ART, PO2ART in the last 168 hours.  Liver Enzymes  Recent Labs Lab 09/06/2015 1451  AST 22  ALT 20  ALKPHOS 98  BILITOT 0.3  ALBUMIN 3.5    Cardiac Enzymes No results for input(s): TROPONINI, PROBNP in the last 168 hours.  Glucose No results for input(s): GLUCAP in the last 168 hours.  Imaging Dg Chest Port 1 View  08/22/2015  CLINICAL DATA:  Endotracheal tube placement. EXAM: PORTABLE CHEST 1 VIEW COMPARISON:  08/28/2015 FINDINGS: Endotracheal tube tip is 2 cm above the carina, well positioned. Low lung volumes are present, causing crowding of the pulmonary vasculature. Borderline cardiomegaly. Mild prominence of right upper paratracheal tissues. Poor definition of the AP window. Indistinct pulmonary vasculature. Improved aeration at the right lung base. Faint interstitial accentuation. Questionable left perihilar density. IMPRESSION: 1. Endotracheal tube satisfactorily position with tip 2 cm above the carina. 2. Low lung volumes. 3. Indistinct left perihilar density. Improved aeration at the right lung base. Borderline appearance for interstitial accentuation. 4. Poor definition of the AP window and right paratracheal tissues. Electronically Signed   By: Gaylyn RongWalter  Liebkemann M.D.   On: 08/28/2015 16:37   Dg Chest Portable 1 View  08/09/2015  CLINICAL DATA:  Overdose, vomiting EXAM: PORTABLE CHEST 1 VIEW COMPARISON:  03/18/2015 FINDINGS: Low lung volumes with vascular congestion. Mild patchy bilateral lower lobe opacities, possibly atelectasis, aspiration not excluded. No pleural effusion or pneumothorax. The heart is normal in size for inspiration. IMPRESSION: Low lung volumes. Mild patchy bilateral lower lobe opacities, possibly atelectasis, aspiration not excluded. Electronically Signed   By: Charline BillsSriyesh  Krishnan M.D.   On: 08/18/2015 15:22     STUDIES:   CULTURES:  ANTIBIOTICS:  SIGNIFICANT EVENTS: 5/21 Admit  LINES/TUBES: 5/21  ETT >>   DISCUSSION: 55 yo with intentional overdose of verapamil, xanax, melatonin, zoloft.  ASSESSMENT / PLAN:  PULMONARY A: Compromised airway. P:   F/u CXR, ABG Full vent support  CARDIOVASCULAR A:  Bradycardia 2nd to overdose. P:  Continue glucagon Monitor hemodynamics Continue IV fluid  RENAL A:   No acute issues. P:   Monitor renal fx, urine outpt, electrolytes  GASTROINTESTINAL A:   Nutrition. P:   NPO Tube feeds if unable to extubate soon Protonix for SUP  HEMATOLOGIC A:   No acute issues. P:  F/u CBC Lovenox for DVT prevention  INFECTIOUS A:   No evidence for infection. P:   Monitor clinically  ENDOCRINE A:   Hyperglycemia.   P:   SSI  NEUROLOGIC A:   Acute encephalopathy 2nd to intention overdose. P:   Monitor mental status Will need psych assessment when medically stable   CC time 42 minutes.  Coralyn HellingVineet Karsynn Deweese, MD [facility] 08/27/2015, 5:04 PM Pager:  [phone] After 3pm call: [phone]

## 2015-08-29 NOTE — Progress Notes (Signed)
When initiating insulin drip, glucose stabilizer instructed insulin drip to be started at 76.5 units/hr.  Dr. Arsenio LoaderSommer called and verifyed high rate of insulin. When programming IV pump, gaurdrails limits would not allow insulin to be run faster than 50 units/hr. Charge [facility] and [facility] into room to assist. Insulin drip programmed as basic infusion.  Rate and dosage verified by 2 RNs.  CBG currently 335, and will be checked q4030min. Each rate changed will be verified by 2 RNs. Will continue to monitor.

## 2015-08-29 NOTE — ED Notes (Signed)
Poison Control notified. CNS & respiratory depression Zoloft-GI upset, QTC prolongation, check K and Mg if prolonged Supportive care - tylenol level Observe patient for 6 hours and asymptomatic

## 2015-08-29 NOTE — ED Notes (Signed)
Pt son Frederik Schmidtyrell Marcellus: [phone] Daughter-in-law Baxter HireKristen [phone]

## 2015-08-29 NOTE — ED Provider Notes (Signed)
CSN: [id number]     Arrival date & time September 21, 2015  1426 History   First MD Initiated Contact with Patient 09/21/15 1430     Chief Complaint  Patient presents with  . Drug Overdose     Level 5 caveat due to altered mental status. [name] presenting with Overdose. The history is provided by the patient and the EMS personnel.  Drug Overdose  Patient presented with an overdose. Reportedly a suicide attempt. Reportedly overdosed on a large amount of Xanax which was her medication. Also overdosed on Zoloft melatonin and verapamil. Reportedly had burned just prior to arrival then went to the parking lot and was found by police. Patient cannot really provide much history  Past Medical History  Diagnosis Date  . Allergy   . Hypertension   . Migraines    Past Surgical History  Procedure Laterality Date  . Abdominal hysterectomy    . Tubal ligation     Family History  Problem Relation Age of Onset  . Hypertension Mother    Social History  Substance Use Topics  . Smoking status: Never Smoker   . Smokeless tobacco: None  . Alcohol Use: 0.0 oz/week    0 Standard drinks or equivalent per week     Comment: occ   OB History    No data available     Review of Systems  Unable to perform ROS: Mental status change      Allergies  Reglan; Compazine; and Contrast media  Home Medications   Prior to Admission medications   Medication Sig Start Date End Date Taking? Authorizing Provider  aspirin-acetaminophen-caffeine (EXCEDRIN MIGRAINE) [phone] MG tablet Take 1 tablet by mouth every 6 (six) hours as needed for headache.    Historical Provider, MD  BIOTIN PO Take 1 tablet by mouth daily.    Historical Provider, MD  butalbital-aspirin-caffeine [facility]) 50-325-40 MG capsule Take 1 capsule by mouth 2 (two) times daily as needed for headache. 06/16/15   Renne Crigler, PA-C  diphenhydrAMINE (BENADRYL) 25 MG tablet Take 100 mg by mouth at bedtime as needed for sleep.     Historical Provider, MD  famotidine (PEPCID) 10 MG tablet Take 10 mg by mouth 2 (two) times daily as needed for heartburn or indigestion.    Historical Provider, MD  Multiple Vitamins-Minerals (MULTIVITAMIN WITH MINERALS) tablet Take 1 tablet by mouth daily.    Historical Provider, MD  verapamil (VERELAN PM) 240 MG 24 hr capsule Take 1 capsule (240 mg total) by mouth daily. 06/16/15   Renne Crigler, PA-C   BP 141/95 mmHg  Pulse 75  Temp(Src) 97.6 F (36.4 C) (Oral)  Resp 20  Ht 5\' 9"  (1.753 m)  SpO2 100% Physical Exam  Constitutional: She appears well-developed.  Patient is morbidly obese  HENT:  Head: Atraumatic.  Eyes:  Pupils reactive but somewhat sluggish.  Cardiovascular:  Mild bradycardia  Pulmonary/Chest: Effort normal.  Transmitted upper airway sounds.  Abdominal: There is no tenderness.  Neurological:  Patient is somewhat somnolent. Will arouse to stimulation and will follow some commands. Difficult to understand. Does have slurred speech. Rambles somewhat incoherently.  Skin: Skin is warm.    ED Course  Procedures (including critical care time) Labs Review Labs Reviewed  ACETAMINOPHEN LEVEL - Abnormal; Notable for the following:    Acetaminophen (Tylenol), Serum <10 (*)    All other components within normal limits  COMPREHENSIVE METABOLIC PANEL - Abnormal; Notable for the following:    Glucose, Bld 164 (*)  All other components within normal limits  I-STAT CHEM 8, ED - Abnormal; Notable for the following:    Glucose, Bld 163 (*)    Hemoglobin 15.3 (*)    All other components within normal limits  SALICYLATE LEVEL  ETHANOL  CBC    Imaging Review Dg Chest Port 1 View  2015/09/18  CLINICAL DATA:  Endotracheal tube placement. EXAM: PORTABLE CHEST 1 VIEW COMPARISON:  09-18-2015 FINDINGS: Endotracheal tube tip is 2 cm above the carina, well positioned. Low lung volumes are present, causing crowding of the pulmonary vasculature. Borderline cardiomegaly. Mild  prominence of right upper paratracheal tissues. Poor definition of the AP window. Indistinct pulmonary vasculature. Improved aeration at the right lung base. Faint interstitial accentuation. Questionable left perihilar density. IMPRESSION: 1. Endotracheal tube satisfactorily position with tip 2 cm above the carina. 2. Low lung volumes. 3. Indistinct left perihilar density. Improved aeration at the right lung base. Borderline appearance for interstitial accentuation. 4. Poor definition of the AP window and right paratracheal tissues. Electronically Signed   By: Gaylyn Rong M.D.   On: Sep 18, 2015 16:37   Dg Chest Portable 1 View  09-18-2015  CLINICAL DATA:  Overdose, vomiting EXAM: PORTABLE CHEST 1 VIEW COMPARISON:  03/18/2015 FINDINGS: Low lung volumes with vascular congestion. Mild patchy bilateral lower lobe opacities, possibly atelectasis, aspiration not excluded. No pleural effusion or pneumothorax. The heart is normal in size for inspiration. IMPRESSION: Low lung volumes. Mild patchy bilateral lower lobe opacities, possibly atelectasis, aspiration not excluded. Electronically Signed   By: Charline Bills M.D.   On: Sep 18, 2015 15:22   I have personally reviewed and evaluated these images and lab results as part of my medical decision-making.   EKG Interpretation   Date/Time:  Sunday 09/18/2015 15:33:50 EDT Ventricular Rate:  60 PR Interval:    QRS Duration: 91 QT Interval:  507 QTC Calculation: 507 R Axis:   -19 Text Interpretation:  AV dissociation RSR' in V1 or V2, probably normal  variant Inferior infarct, old Consider anterior infarct Baseline wander in  lead(s) V5 Confirmed by Zorina Mallin  MD, Harrold Donath [phone]) on 18-Sep-2015 3:37:32  PM      MDM   Final diagnoses:  Overdose, intentional self-harm, initial encounter (HCC)  Hypotension due to drugs  Complete AV block [facility]    Patient presents with an overdose. Unclear exactly what the doses were but apparently medications  were verapamil Xanax Zoloft and melatonin. Mental status initially decreased but speak more mental status has decreased somewhat. Intubated for airway protection. Also bradycardic with apparent AV dissociation. QRS complex is still narrow. Also hypotension. Patient intubated for airway protection. Given glucagon bolus and drip. Also given bolus phenylephrine with intubation. Had recurrent hypotension. Started on insulin drip for pressor support. Given dextrose bolus also. May need pressor support also. Admit to ICU. Discussed repeatedly with Dr. Levada Schilling.  CRITICAL CARE Performed by: Billee Cashing Total critical care time: 60 minutes Critical care time was exclusive of separately billable procedures and treating other patients. Critical care was necessary to treat or prevent imminent or life-threatening deterioration. Critical care was time spent personally by me on the following activities: development of treatment plan with patient and/or surrogate as well as nursing, discussions with consultants, evaluation of patient's response to treatment, examination of patient, obtaining history from patient or surrogate, ordering and performing treatments and interventions, ordering and review of laboratory studies, ordering and review of radiographic studies, pulse oximetry and re-evaluation of patient's condition.  INTUBATION Performed by: Billee Cashing  Required items: required blood products, implants, devices, and special equipment available Patient identity confirmed: provided demographic data and hospital-assigned identification number Time out: Immediately prior to procedure a "time out" was called to verify the correct patient, procedure, equipment, support staff and site/side marked as required.  Indications: airway protection  Intubation method: Glidescope Laryngoscopy   Preoxygenation: BVM  Sedatives: Etomidate Paralytic: *Roccuronioum  Tube Size: 7.5 cuffed  Post-procedure  assessment: chest rise and ETCO2 monitor Breath sounds: equal and absent over the epigastrium Tube secured with: ETT holder Chest x-ray interpreted by radiologist and me.  Chest x-ray findings: endotracheal tube in appropriate position  Patient tolerated the procedure well with no immediate complications.      Benjiman CoreNathan Zitlali Primm, MD 2015-06-14 [phone]

## 2015-08-29 NOTE — ED Notes (Signed)
SubQ insulin and IV drip insulin help per Rubin PayorPickering, MD based on giving 100 units of IV insulin and amp of d50

## 2015-08-29 NOTE — ED Notes (Signed)
Bed: RESA Expected date:  Expected time:  Means of arrival:  Comments: EMS- 55 yo OD xanax, melatonin-emesis

## 2015-08-29 NOTE — ED Notes (Signed)
Pt was [place] up from mall parking lot after bring found by security via EMS- Per EMS- pt sts that she took "verapamil 212mg , xanax 240 mg, zoloft, melatonin- whole bottle". Pt told EMS that she has not been sleeping and was trying to sleep in mal parking lot. Pt then changed her story to being SI because she is having to take care of her mother. Pt had emesis en route and arrives with slurred speech. Dr Rubin PayorPickering at bedside. Pt in NAD

## 2015-08-29 NOTE — Progress Notes (Signed)
eLink Physician-Brief Progress Note Patient Name: Kimberly PrestoBrenda Mcintyre DOB: 1961/01/02 MRN: [id number]   Date of Service  08/22/2015  HPI/Events of Note  Hypotension - BP = 65/29.   eICU Interventions  Will order: 1. 0.9 NaCl 1 liter IV over 1 hour now. 2. Phenylephrine IV infusion. Titrate to MAP >= 65.     Intervention Category Major Interventions: Hypotension - evaluation and management  [name] 09/05/2015, 7:35 PM

## 2015-08-29 NOTE — Progress Notes (Signed)
eLink Physician-Brief Progress Note Patient Name: Kimberly PrestoBrenda Mcintyre DOB: October 29, 1960 MRN: [id number]   Date of Service  08/26/2015  HPI/Events of Note  Multiple issues: 1. Patient on Q 4 hour Novolog SSI and insulin IV infusion and 2. Multiplier on Glucose stabilizer = 0.5. Multiplier maximum is 0.3.  eICU Interventions  Will order: 1. D/C Q 4 hour Novolog SSI.  2. Use multiplier of 0.3 on Glucose stabilizer.     Intervention Category Major Interventions: Hyperglycemia - active titration of insulin therapy  Kimberly Mcintyre,Kimberly Mcintyre 08/28/2015, 8:08 PM

## 2015-08-29 NOTE — ED Notes (Addendum)
Patient was intubated by MD at 16:08. Color change noted and lung sounds auscultated. Equal rise and fall of chest noted

## 2015-08-29 NOTE — ED Notes (Signed)
Hospitalist at bedside 

## 2015-08-29 NOTE — Progress Notes (Signed)
Per patient's son Kimberly Mcintyre, patient is Jehovah's Witness and does not want blood transfusions. Son provided copy of advance directive containing details of patient's wishes.  Copy made and place in chart.

## 2015-08-30 ENCOUNTER — Inpatient Hospital Stay (HOSPITAL_COMMUNITY): Payer: Managed Care, Other (non HMO)

## 2015-08-30 DIAGNOSIS — N179 Acute kidney failure, unspecified: Secondary | ICD-10-CM

## 2015-08-30 DIAGNOSIS — J9601 Acute respiratory failure with hypoxia: Secondary | ICD-10-CM

## 2015-08-30 DIAGNOSIS — I959 Hypotension, unspecified: Secondary | ICD-10-CM

## 2015-08-30 DIAGNOSIS — I952 Hypotension due to drugs: Secondary | ICD-10-CM | POA: Insufficient documentation

## 2015-08-30 LAB — BLOOD GAS, ARTERIAL
ACID-BASE DEFICIT: 7 mmol/L — AB (ref 0.0–2.0)
Bicarbonate: 18.4 mEq/L — ABNORMAL LOW (ref 20.0–24.0)
DRAWN BY: 422461
FIO2: 0.8
O2 SAT: 97.9 %
PATIENT TEMPERATURE: 97.7
PEEP/CPAP: 5 cmH2O
PH ART: 7.306 — AB (ref 7.350–7.450)
RATE: 20 resp/min
TCO2: 16.9 mmol/L (ref 0–100)
VT: 530 mL
pCO2 arterial: 37.7 mmHg (ref 35.0–45.0)
pO2, Arterial: 112 mmHg — ABNORMAL HIGH (ref 80.0–100.0)

## 2015-08-30 LAB — PHOSPHORUS: Phosphorus: 1.1 mg/dL — ABNORMAL LOW (ref 2.5–4.6)

## 2015-08-30 LAB — GLUCOSE, CAPILLARY
GLUCOSE-CAPILLARY: 211 mg/dL — AB (ref 65–99)
GLUCOSE-CAPILLARY: 240 mg/dL — AB (ref 65–99)
GLUCOSE-CAPILLARY: 288 mg/dL — AB (ref 65–99)
GLUCOSE-CAPILLARY: 368 mg/dL — AB (ref 65–99)
GLUCOSE-CAPILLARY: 37 mg/dL — AB (ref 65–99)
GLUCOSE-CAPILLARY: 405 mg/dL — AB (ref 65–99)
GLUCOSE-CAPILLARY: 54 mg/dL — AB (ref 65–99)
GLUCOSE-CAPILLARY: 84 mg/dL (ref 65–99)
Glucose-Capillary: 369 mg/dL — ABNORMAL HIGH (ref 65–99)
Glucose-Capillary: 333 mg/dL — ABNORMAL HIGH (ref 65–99)
Glucose-Capillary: 322 mg/dL — ABNORMAL HIGH (ref 65–99)
Glucose-Capillary: 287 mg/dL — ABNORMAL HIGH (ref 65–99)
Glucose-Capillary: 276 mg/dL — ABNORMAL HIGH (ref 65–99)
Glucose-Capillary: 241 mg/dL — ABNORMAL HIGH (ref 65–99)
Glucose-Capillary: 187 mg/dL — ABNORMAL HIGH (ref 65–99)
Glucose-Capillary: 147 mg/dL — ABNORMAL HIGH (ref 65–99)
Glucose-Capillary: 80 mg/dL (ref 65–99)
Glucose-Capillary: 110 mg/dL — ABNORMAL HIGH (ref 65–99)
Glucose-Capillary: 110 mg/dL — ABNORMAL HIGH (ref 65–99)
Glucose-Capillary: 175 mg/dL — ABNORMAL HIGH (ref 65–99)
Glucose-Capillary: 280 mg/dL — ABNORMAL HIGH (ref 65–99)

## 2015-08-30 LAB — BASIC METABOLIC PANEL
ANION GAP: 10 (ref 5–15)
ANION GAP: 12 (ref 5–15)
BUN: 25 mg/dL — ABNORMAL HIGH (ref 6–20)
BUN: 29 mg/dL — AB (ref 6–20)
CALCIUM: 7.9 mg/dL — AB (ref 8.9–10.3)
CHLORIDE: 110 mmol/L (ref 101–111)
CO2: 17 mmol/L — ABNORMAL LOW (ref 22–32)
CO2: 14 mmol/L — AB (ref 22–32)
Calcium: 7.9 mg/dL — ABNORMAL LOW (ref 8.9–10.3)
Chloride: 109 mmol/L (ref 101–111)
Creatinine, Ser: 1.64 mg/dL — ABNORMAL HIGH (ref 0.44–1.00)
Creatinine, Ser: 2.59 mg/dL — ABNORMAL HIGH (ref 0.44–1.00)
GFR calc Af Amer: 23 mL/min — ABNORMAL LOW (ref >60)
GFR calc non Af Amer: 34 mL/min — ABNORMAL LOW (ref >60)
GFR, EST AFRICAN AMERICAN: 39 mL/min — AB (ref >60)
GFR, EST NON AFRICAN AMERICAN: 20 mL/min — AB (ref >60)
GLUCOSE: 358 mg/dL — AB (ref 65–99)
Glucose, Bld: 47 mg/dL — ABNORMAL LOW (ref 65–99)
Potassium: 2.7 mmol/L — CL (ref 3.5–5.1)
Potassium: 4.8 mmol/L (ref 3.5–5.1)
SODIUM: 137 mmol/L (ref 135–145)
Sodium: 135 mmol/L (ref 135–145)

## 2015-08-30 LAB — COMPREHENSIVE METABOLIC PANEL
ALBUMIN: 3 g/dL — AB (ref 3.5–5.0)
ALK PHOS: 87 U/L (ref 38–126)
ALT: 34 U/L (ref 14–54)
ANION GAP: 7 (ref 5–15)
AST: 41 U/L (ref 15–41)
BUN: 18 mg/dL (ref 6–20)
CALCIUM: 7.8 mg/dL — AB (ref 8.9–10.3)
CHLORIDE: 110 mmol/L (ref 101–111)
CO2: 17 mmol/L — AB (ref 22–32)
Creatinine, Ser: 1.38 mg/dL — ABNORMAL HIGH (ref 0.44–1.00)
GFR calc non Af Amer: 42 mL/min — ABNORMAL LOW (ref >60)
GFR, EST AFRICAN AMERICAN: 49 mL/min — AB (ref >60)
Glucose, Bld: 398 mg/dL — ABNORMAL HIGH (ref 65–99)
POTASSIUM: 3.7 mmol/L (ref 3.5–5.1)
SODIUM: 134 mmol/L — AB (ref 135–145)
Total Bilirubin: 0.4 mg/dL (ref 0.3–1.2)
Total Protein: 6.6 g/dL (ref 6.5–8.1)

## 2015-08-30 LAB — MAGNESIUM: Magnesium: 1.6 mg/dL — ABNORMAL LOW (ref 1.7–2.4)

## 2015-08-30 LAB — CBC
HEMATOCRIT: 40.3 % (ref 36.0–46.0)
HEMOGLOBIN: 13.2 g/dL (ref 12.0–15.0)
MCH: 29.5 pg (ref 26.0–34.0)
MCHC: 32.8 g/dL (ref 30.0–36.0)
MCV: 90 fL (ref 78.0–100.0)
Platelets: 274 10*3/uL (ref 150–400)
RBC: 4.48 MIL/uL (ref 3.87–5.11)
RDW: 13.8 % (ref 11.5–15.5)
WBC: 15 10*3/uL — ABNORMAL HIGH (ref 4.0–10.5)

## 2015-08-30 LAB — PROCALCITONIN: PROCALCITONIN: 62.05 ng/mL

## 2015-08-30 MED ORDER — VITAL HIGH PROTEIN PO LIQD
1000.0000 mL | ORAL | Status: DC
  Filled 2015-08-30 (×2): qty 1000

## 2015-08-30 MED ORDER — PIPERACILLIN-TAZOBACTAM 3.375 G IVPB
3.3750 g | Freq: Three times a day (TID) | INTRAVENOUS | Status: DC
  Administered 2015-08-31 (×2): 3.375 g via INTRAVENOUS
  Filled 2015-08-30 (×2): qty 50

## 2015-08-30 MED ORDER — FUROSEMIDE 10 MG/ML IJ SOLN
80.0000 mg | Freq: Two times a day (BID) | INTRAMUSCULAR | Status: DC
  Administered 2015-08-30: 80 mg via INTRAVENOUS
  Filled 2015-08-30: qty 8

## 2015-08-30 MED ORDER — PANTOPRAZOLE SODIUM 40 MG PO PACK
40.0000 mg | PACK | ORAL | Status: DC
  Administered 2015-08-30 – 2015-08-31 (×2): 40 mg
  Filled 2015-08-30 (×2): qty 20

## 2015-08-30 MED ORDER — VITAL HIGH PROTEIN PO LIQD: 1000.0000 mL | ORAL | Status: DC

## 2015-08-30 MED ORDER — PIPERACILLIN-TAZOBACTAM 3.375 G IVPB
3.3750 g | INTRAVENOUS | Status: AC
  Administered 2015-08-30: 3.375 g via INTRAVENOUS
  Filled 2015-08-30: qty 50

## 2015-08-30 MED ORDER — PHENYLEPHRINE HCL 10 MG/ML IJ SOLN
0.0000 ug/min | INTRAVENOUS | Status: DC
  Filled 2015-08-30 (×2): qty 1

## 2015-08-30 MED ORDER — DEXTROSE 50 % IV SOLN
25.0000 mL | Freq: Once | INTRAVENOUS | Status: AC
  Administered 2015-08-30: 25 mL via INTRAVENOUS

## 2015-08-30 MED ORDER — DOPAMINE-DEXTROSE 3.2-5 MG/ML-% IV SOLN
0.0000 ug/kg/min | INTRAVENOUS | Status: DC
  Administered 2015-08-30: 4 ug/kg/min via INTRAVENOUS
  Administered 2015-08-31: 5 ug/kg/min via INTRAVENOUS
  Filled 2015-08-30 (×2): qty 250

## 2015-08-30 MED ORDER — POTASSIUM CHLORIDE 10 MEQ/50ML IV SOLN
10.0000 meq | INTRAVENOUS | Status: AC
  Administered 2015-08-30 (×4): 10 meq via INTRAVENOUS
  Filled 2015-08-30 (×4): qty 50

## 2015-08-30 MED ORDER — MAGNESIUM SULFATE 2 GM/50ML IV SOLN
2.0000 g | Freq: Once | INTRAVENOUS | Status: AC
  Administered 2015-08-30: 2 g via INTRAVENOUS
  Filled 2015-08-30: qty 50

## 2015-08-30 MED ORDER — FUROSEMIDE 10 MG/ML IJ SOLN
40.0000 mg | Freq: Once | INTRAMUSCULAR | Status: AC
  Administered 2015-08-30: 40 mg via INTRAVENOUS
  Filled 2015-08-30: qty 4

## 2015-08-30 MED ORDER — K PHOS MONO-SOD PHOS DI & MONO 155-852-130 MG PO TABS
500.0000 mg | ORAL_TABLET | Freq: Four times a day (QID) | ORAL | Status: DC
  Administered 2015-08-30: 500 mg
  Filled 2015-08-30 (×4): qty 2

## 2015-08-30 MED ORDER — SODIUM CHLORIDE 0.9 % IV BOLUS (SEPSIS)
1000.0000 mL | Freq: Once | INTRAVENOUS | Status: AC
  Administered 2015-08-30: 1000 mL via INTRAVENOUS

## 2015-08-30 MED ORDER — POTASSIUM & SODIUM PHOSPHATES 280-160-250 MG PO PACK
2.0000 | PACK | Freq: Four times a day (QID) | ORAL | Status: AC
  Administered 2015-08-30 (×2): 2 via ORAL
  Filled 2015-08-30 (×2): qty 2

## 2015-08-30 MED ORDER — NOREPINEPHRINE BITARTRATE 1 MG/ML IV SOLN
2.0000 ug/min | INTRAVENOUS | Status: DC
  Administered 2015-08-30: 5 ug/min via INTRAVENOUS
  Filled 2015-08-30 (×3): qty 16

## 2015-08-30 MED ORDER — GLUCAGON HCL RDNA (DIAGNOSTIC) 1 MG IJ SOLR
3.0000 mg/h | INTRAMUSCULAR | Status: DC
  Filled 2015-08-30: qty 5

## 2015-08-30 MED ORDER — SODIUM PHOSPHATES 45 MMOLE/15ML IV SOLN
30.0000 mmol | Freq: Once | INTRAVENOUS | Status: AC
  Administered 2015-08-30: 30 mmol via INTRAVENOUS
  Filled 2015-08-30: qty 10

## 2015-08-30 MED ORDER — PRO-STAT SUGAR FREE PO LIQD
30.0000 mL | Freq: Three times a day (TID) | ORAL | Status: DC
  Administered 2015-08-30: 30 mL via ORAL
  Filled 2015-08-30: qty 30

## 2015-08-30 MED ORDER — DEXTROSE 5 % IV SOLN
3.0000 mg/h | INTRAVENOUS | Status: DC
  Administered 2015-08-30 – 2015-08-31 (×4): 3 mg/h via INTRAVENOUS
  Filled 2015-08-30 (×7): qty 10

## 2015-08-30 MED ORDER — PHENYLEPHRINE HCL 10 MG/ML IJ SOLN
0.0000 ug/min | INTRAVENOUS | Status: DC
  Administered 2015-08-30: 20 ug/min via INTRAVENOUS
  Filled 2015-08-30: qty 4

## 2015-08-30 MED ORDER — SODIUM CHLORIDE 0.9 % IV SOLN
INTRAVENOUS | Status: DC
  Administered 2015-08-31: 24.8 [IU]/h via INTRAVENOUS
  Filled 2015-08-30 (×2): qty 2.5

## 2015-08-30 MED ORDER — SODIUM CHLORIDE 0.9 % IV SOLN
2.0000 g | Freq: Once | INTRAVENOUS | Status: AC
  Administered 2015-08-30: 2 g via INTRAVENOUS
  Filled 2015-08-30: qty 20

## 2015-08-30 MED ORDER — ENOXAPARIN SODIUM 80 MG/0.8ML ~~LOC~~ SOLN
0.5000 mg/kg | SUBCUTANEOUS | Status: DC
  Administered 2015-08-30: 75 mg via SUBCUTANEOUS
  Filled 2015-08-30 (×2): qty 0.8

## 2015-08-30 NOTE — Progress Notes (Signed)
eLink Physician-Brief Progress Note Patient Name: Debbora PrestoBrenda Gilden DOB: [birth year] MRN: [id number]   Date of Service  08/30/2015  HPI/Events of Note  Multiple issues: 1. Hypoxia - O2 sat = 91% on 80% FiO2 and PEEP = 5, CXR has R base atelectasis vs infiltrate. 2. Oliguria and 3. Bradycardia - HR = 39. Dopamine IV infusion starting now.   eICU Interventions  Will order: 1. Increase PEEP to 10 cm H20. 2. Bolus with 0.9 NaCl 1 liter IV over 1 hour now. 3. Monitor CVP. 4. Follow HR on Dopamine. May need Glucagon IV infusion restarted.      Intervention Category Major Interventions: Arrhythmia - evaluation and management;Hypoxemia - evaluation and management Intermediate Interventions: Oliguria - evaluation and management  Regena Delucchi Eugene 08/30/2015, 3:31 PM

## 2015-08-30 NOTE — Progress Notes (Signed)
eLink Physician-Brief Progress Note Patient Name: Kimberly PrestoBrenda Mcintyre DOB: 1961-01-16 MRN: [id number]   Date of Service  08/30/2015  HPI/Events of Note  BP = 103/28 w/MAP = 51 and HR = 40. Now on Dopamine IV infusion at 15 mcg/kg/min.  eICU Interventions  Will order: 1. Restart Glucagon IV infusion.     Intervention Category Major Interventions: Arrhythmia - evaluation and management  [name] 08/30/2015, 4:21 PM

## 2015-08-30 NOTE — Procedures (Signed)
Central Venous Catheter Insertion Procedure Note Kimberly PrestoBrenda Mcintyre [id number] 09-22-1960  Procedure: Insertion of Central Venous Catheter Indications: Assessment of intravascular volume, Drug and/or fluid administration and Frequent blood sampling  Procedure Details Consent: Unable to obtain consent because of emergent medical necessity. Time Out: Verified patient identification, verified procedure, site/side was marked, verified correct patient position, special equipment/implants available, medications/allergies/relevent history reviewed, required imaging and test results available.  Performed Real time US was used to ID and cannulate the left IJ  Maximum sterile technique was used including antiseptics, cap, gloves, gown, hand hygiene, mask and sheet. Skin prep: Chlorhexidine; local anesthetic administered A antimicrobial bonded/coated triple lumen catheter was placed in the left internal jugular vein using the Seldinger technique.  Evaluation Blood flow good Complications: No apparent complications Patient did tolerate procedure well. Chest X-ray ordered to verify placement.  CXR: pending.  Kimberly Mcintyre E Kimberly Mcintyre 08/30/2015, 2:11 PM  Simonne MartinetPeter E Kimberly Mcintyre ACNP-BC [facility] Pulmonary/Critical Care Pager # [phone] OR # [phone] if no answer

## 2015-08-30 NOTE — Progress Notes (Signed)
Initial Nutrition Assessment  DOCUMENTATION CODES:   Morbid obesity  INTERVENTION:  - If pt to remain intubated >/= 24 hours and TF warranted, recommend Vital High Protein @ 60 mL/hr with 30 mL Prostat TID which will provide 1740 kcal, 171 grams of protein, and 1204 mL free water. - RD will follow-up 5/23  NUTRITION DIAGNOSIS:   Inadequate oral intake related to inability to eat as evidenced by NPO status.  GOAL:   Provide needs based on ASPEN/SCCM guidelines  MONITOR:   Vent status, Weight trends, Labs, Skin, I & O's  REASON FOR ASSESSMENT:   Ventilator  ASSESSMENT:   Pt found at mall parking lot. Told EMS that she took 212 mg verapamil, 240 mg xanax, zoloft, and whole bottle of melatonin. She told EMS she can't sleep, and was trying to take medicine to help sleep. She later said she wanted to kill herself because she had too much stress taking care of her mother. Her mental status became progressively worse, and she required intubation for airway protection in the ER. She had bradycardia. She was started on glucagon with improvement in heart rhythm/rate and blood pressure.  Pt seen for new vent. BMI indicates morbid obesity. No family/visitors present to provide information from PTA. Pt with suicide attempt PTA and sitter at bedside. Pt was intubated 5/21 around 1600.   Patient is currently intubated on ventilator support with NGT in place.  MV: 14.3 L/min Temp (24hrs), Avg:97.5 F (36.4 C), Min:97 F (36.1 C), Max:97.9 F (36.6 C) Propofol: none  Unable to complete physical assessment at this time with bilateral mitten restraints in place; no visual wasting present. Per chart review, pt has gained 16 lbs in the past 7-8 months.  TF recommendations outlined above should pt be unable to be extubated within the next 24 hours. RD will follow-up 5/23. Medications reviewed. Labs reviewed; CBGs: 240-287 mg/dL, Na: 161134 mmol/L, creatinine elevated, Phos: 1.1 mg/dL, Mg: 1.6  mg/dL, Ca: 7.8 mg/dL, GFR: 42.  IVF: NS @ 125 mL/hr. Drip: Neo @ 15 mcg/min   Diet Order:  Diet NPO time specified  Skin:  Reviewed, no issues  Last BM:  PTA  Height:   Ht Readings from Last 1 Encounters:  2015/10/27 5\' 9"  (1.753 m)    Weight:   Wt Readings from Last 1 Encounters:  2015/10/27 334 lb 10.5 oz (151.8 kg)    Ideal Body Weight:  65.91 kg (kg)  BMI:  Body mass index is 49.4 kg/(m^2).  Estimated Nutritional Needs:   Kcal:  [id number] (11-13 kcal/kg acutal weight)  Protein:  165 grams (2.5 grams/kg IBW)  Fluid:  2 L/day  EDUCATION NEEDS:   No education needs identified at this time     Trenton GammonJessica Keanon Bevins, RD, LDN Inpatient Clinical Dietitian Pager # [phone] After hours/weekend pager # [phone]

## 2015-08-30 NOTE — Progress Notes (Signed)
eLink Physician-Brief Progress Note Patient Name: Kimberly PrestoBrenda Mcintyre DOB: 12-04-1960 MRN: [id number]   Date of Service  08/30/2015  HPI/Events of Note  Oliguria - CVP = 23. Creatinine = 1.38 >> 2.59.  eICU Interventions  Will order Lasix 80 mg IV now.      Intervention Category Intermediate Interventions: Oliguria - evaluation and management  Sommer,Steven Eugene 08/30/2015, 10:42 PM

## 2015-08-30 NOTE — Care Management Note (Signed)
Case Management Note  Patient Details  Name: Kimberly Mcintyre MRN: [id number] Date of Birth: 01-04-1961  Subjective/Objective:              overdose      Action/Plan: Date:  Aug 30, 2015 Chart reviewed for concurrent status and case management needs. Will continue to follow patient for changes and needs: Expected discharge date: [id number] Marcelle SmilingRhonda Davis, BSN, [place]3, [place]   [phone]  Expected Discharge Date:                  [facility]  In-House Referral:  Clinical Social Work  Discharge planning Services  CM Consult  Post Acute Care Choice:  NA Choice offered to:  NA  DME Arranged:  N/A DME Agency:  NA  HH Arranged:  NA HH Agency:  NA  Status of Service:  In process, will continue to follow  Medicare Important Message Given:    Date Medicare IM Given:    Medicare IM give by:    Date Additional Medicare IM Given:    Additional Medicare Important Message give by:     If discussed at Long Length of Stay Meetings, dates discussed:    Additional Comments:  [name], Rhonda Lynn, RN 08/30/2015, 9:46 AM

## 2015-08-30 NOTE — Progress Notes (Signed)
RT Note: RT obtained sputum culture & sent to lab per MD request. Pt tolerated well, RT to monitor.

## 2015-08-30 NOTE — Progress Notes (Signed)
Pharmacy Antibiotic Note  Kimberly Mcintyre is a 55 y.o. female admitted on 08/14/2015 with intentional overdose with verapamil, xanax, melatonin, and zoloft .  Pharmacy has been consulted for Zosyn dosing for aspiration PNA.  Noted AKI.    Plan: Zosyn 3.375g IV q8h (4 hour infusion).  F/u renal function, cultures, clinical course  Height: 5\' 9"  (175.3 cm) Weight: (!) 334 lb 10.5 oz (151.8 kg) IBW/kg (Calculated) : 66.2  Temp (24hrs), Avg:97.6 F (36.4 C), Min:97 F (36.1 C), Max:97.9 F (36.6 C)   Recent Labs Lab 08/10/2015 1451 09/06/2015 1504 08/30/15 0120 08/30/15 1430  WBC 10.4  --  15.0*  --   CREATININE 0.94 0.80 1.38* 1.64*    Estimated Creatinine Clearance: 60.7 mL/min (by C-G formula based on Cr of 1.64).    Allergies  Allergen Reactions  . 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 Compazine [Prochlorperazine Edisylate] Anxiety  . Contrast Media [Iodinated Diagnostic Agents] Rash and Other (See Comments)    Dye for the CT contrast, causes heaviness in her chest    Antimicrobials this admission: 5/22 Zosyn >>   Dose adjustments this admission:   Microbiology results: 5/22 Sputum: Collected  5/21 MRSA PCR: Negative  Thank you for allowing pharmacy to be a part of this patient's care.  Haynes Hoehnolleen Shaheed Schmuck, PharmD, BCPS 08/30/2015, 5:09 PM  Pager: [phone]

## 2015-08-30 NOTE — Progress Notes (Signed)
eLink Physician-Brief Progress Note Patient Name: Kimberly PrestoBrenda Mcintyre DOB: 1960-12-05 MRN: [id number]   Date of Service  08/30/2015  HPI/Events of Note  Multiple issues: 1. BP = 105/27 w/MAP = 53. Currently on Phenylephrine and Dopamine IV infusion.   eICU Interventions  Will order: 1. Start on Norepinephrine IV infusion. 2. Wean Dopamine and Phenylephrine IV infusions off as tolerated.      Intervention Category Major Interventions: Infection - evaluation and management;Hypotension - evaluation and management;Arrhythmia - evaluation and management  Lenell AntuSommer,Steven Eugene 08/30/2015, 4:48 PM

## 2015-08-30 NOTE — Progress Notes (Signed)
eLink Physician-Brief Progress Note Patient Name: Kimberly PrestoBrenda Mcintyre DOB: 08-07-1960 MRN: [id number]   Date of Service  08/30/2015  HPI/Events of Note    eICU Interventions  Hypophos -repleted      Intervention Category Intermediate Interventions: Electrolyte abnormality - evaluation and management  ALVA,RAKESH V. 08/30/2015, 3:06 AM

## 2015-08-30 NOTE — Progress Notes (Signed)
eLink Physician-Brief Progress Note Patient Name: Kimberly PrestoBrenda Mcintyre DOB: 02-24-1961 MRN: [id number]   Date of Service  08/30/2015  HPI/Events of Note  Procalcitonin = 62.05.   eICU Interventions  Will order: 1. Zosyn per pharmacy consultation.      Intervention Category Major Interventions: Infection - evaluation and management  Sommer,Steven Eugene 08/30/2015, 5:03 PM

## 2015-08-30 NOTE — Progress Notes (Signed)
eLink Physician-Brief Progress Note Patient Name: Kimberly PrestoBrenda Mcintyre DOB: 1960/10/13 MRN: [id number]   Date of Service  08/30/2015  HPI/Events of Note  Low FIO2 Oliguric - 6L in  eICU Interventions  Dc NS Increase PEEP 12-15 - rpt pCXR     Intervention Category Major Interventions: Acute renal failure - evaluation and management;Respiratory failure - evaluation and management  [name] V. 08/30/2015, 11:24 PM

## 2015-08-30 NOTE — Progress Notes (Signed)
PULMONARY / CRITICAL CARE MEDICINE   Name: Kimberly Mcintyre MRN: [id number] DOB: Sep 30, 1960    ADMISSION DATE:  08/26/2015  REFERRING MD:  Dr. Rubin PayorPickering  CHIEF COMPLAINT:  Altered mental status.  SUBJECTIVE:  Started on pressors over night.  VITAL SIGNS: BP 119/46 mmHg  Pulse 54  Temp(Src) 97.5 F (36.4 C) (Axillary)  Resp 22  Ht 5\' 9"  (1.753 m)  Wt 334 lb 10.5 oz (151.8 kg)  BMI 49.40 kg/m2  SpO2 97%  HEMODYNAMICS:    VENTILATOR SETTINGS: Vent Mode:  [-] PRVC FiO2 (%):  [60 %-100 %] 60 % Set Rate:  [20 bmp] 20 bmp Vt Set:  [530 mL] 530 mL PEEP:  [5 cmH20] 5 cmH20 Plateau Pressure:  [14 cmH20-27 cmH20] 14 cmH20  INTAKE / OUTPUT: I/O last 3 completed shifts: In: 4787.9 [I.V.:3197.9; IV Piggyback:1590] Out: 755 [Urine:755]  PHYSICAL EXAMINATION: General: somnolent Neuro: wakes up easily, follows commands, moves all extremities HEENT: ETT in place Cardiovascular:  Bradycardic, regular, no murmur Lungs:  No wheeze/rales Abdomen:  Soft, non tender Musculoskeletal:  No edema Skin:  No rashes  LABS:  BMET  Recent Labs Lab 08/24/2015 1451 08/19/2015 1504 08/30/15 0120  NA 137 141 134*  K 4.2 4.4 3.7  CL 107 107 110  CO2 22  --  17*  BUN 13 16 18   CREATININE 0.94 0.80 1.38*  GLUCOSE 164* 163* 398*    Electrolytes  Recent Labs Lab 08/30/2015 1451 08/30/15 0120  CALCIUM 9.2 7.8*  MG  --  1.6*  PHOS  --  1.1*    CBC  Recent Labs Lab 08/23/2015 1451 08/23/2015 1504 08/30/15 0120  WBC 10.4  --  15.0*  HGB 13.8 15.3* 13.2  HCT 41.3 45.0 40.3  PLT 299  --  274    Coag's No results for input(s): APTT, INR in the last 168 hours.  Sepsis Markers No results for input(s): LATICACIDVEN, PROCALCITON, O2SATVEN in the last 168 hours.  ABG  Recent Labs Lab 08/30/2015 1835 08/30/15 0518  PHART 7.265* 7.306*  PCO2ART 40.6 37.7  PO2ART 162* 112*    Liver Enzymes  Recent Labs Lab 08/22/2015 1451 08/30/15 0120  AST 22 41  ALT 20 34  ALKPHOS 98 87   BILITOT 0.3 0.4  ALBUMIN 3.5 3.0*    Cardiac Enzymes No results for input(s): TROPONINI, PROBNP in the last 168 hours.  Glucose  Recent Labs Lab 08/30/15 0326 08/30/15 0425 08/30/15 0525 08/30/15 0618 08/30/15 0720 08/30/15 0817  GLUCAP 333* 322* 287* 276* 241* 240*    Imaging Dg Chest Port 1 View  08/30/2015  CLINICAL DATA:  Respiratory failure. EXAM: PORTABLE CHEST 1 VIEW COMPARISON:  08/18/2015. FINDINGS: Cardiomegaly. Improved vascular congestion. Improved lung volumes. ET tube 3.7 cm above carina, satisfactory position. No pneumothorax. Slight blunting LEFT CP angle. Enteric tube lies somewhere in stomach. IMPRESSION: Improved aeration.  ET tube in good position. Electronically Signed   By: Elsie StainJohn T Curnes M.D.   On: 08/30/2015 07:06   Dg Chest Port 1 View  08/21/2015  CLINICAL DATA:  Endotracheal tube placement. EXAM: PORTABLE CHEST 1 VIEW COMPARISON:  09/07/2015 FINDINGS: Endotracheal tube tip is 2 cm above the carina, well positioned. Low lung volumes are present, causing crowding of the pulmonary vasculature. Borderline cardiomegaly. Mild prominence of right upper paratracheal tissues. Poor definition of the AP window. Indistinct pulmonary vasculature. Improved aeration at the right lung base. Faint interstitial accentuation. Questionable left perihilar density. IMPRESSION: 1. Endotracheal tube satisfactorily position with tip 2 cm above  the carina. 2. Low lung volumes. 3. Indistinct left perihilar density. Improved aeration at the right lung base. Borderline appearance for interstitial accentuation. 4. Poor definition of the AP window and right paratracheal tissues. Electronically Signed   By: Gaylyn Rong M.D.   On: 2015-09-05 16:37   Dg Chest Portable 1 View  09/05/2015  CLINICAL DATA:  Overdose, vomiting EXAM: PORTABLE CHEST 1 VIEW COMPARISON:  03/18/2015 FINDINGS: Low lung volumes with vascular congestion. Mild patchy bilateral lower lobe opacities, possibly  atelectasis, aspiration not excluded. No pleural effusion or pneumothorax. The heart is normal in size for inspiration. IMPRESSION: Low lung volumes. Mild patchy bilateral lower lobe opacities, possibly atelectasis, aspiration not excluded. Electronically Signed   By: Charline Bills M.D.   On: 09/05/15 15:22   Dg Abd Portable 1v  08/30/2015  CLINICAL DATA:  NG tube placement. EXAM: PORTABLE ABDOMEN - 1 VIEW COMPARISON:  None. FINDINGS: Tip and side port of the enteric tube in the upper abdomen below the diaphragm in the stomach. No dilated bowel loops in the included left abdomen. IMPRESSION: Tip and side port of the enteric tube below the diaphragm in the stomach. Electronically Signed   By: Rubye Oaks M.D.   On: 08/30/2015 04:09     STUDIES:   CULTURES:  ANTIBIOTICS:  SIGNIFICANT EVENTS: 5/21 Admit 5/22 d/c glucagon  LINES/TUBES: 5/21 ETT >>   DISCUSSION: 55 yo with intentional overdose of verapamil, xanax, melatonin, zoloft.  ASSESSMENT / PLAN:  PULMONARY A: Compromised airway. P:   F/u CXR intermittently Pressure support wean as tolerated >> extubate when mental status improved further  CARDIOVASCULAR A:  Bradycardia 2nd to overdose. Hypotension 2nd to bradycardia and hypovolemia. P:  Fluid bolus Wean pressors to keep MAP > 65 - defer CVL for now D/c glucagon 5/22  RENAL A:   AKI. Non gap metabolic acidosis. Hypokalemia, hypomagnesemia, hypophosphatemia. P:   Monitor renal fx, urine outpt Replace electrolytes  GASTROINTESTINAL A:   Nutrition. P:   NPO Tube feeds if unable to extubate soon Protonix for SUP  HEMATOLOGIC A:   No acute issues. P:  F/u CBC Lovenox for DVT prevention  INFECTIOUS A:   No evidence for infection. P:   Monitor clinically  ENDOCRINE A:   Hyperglycemia.   P:   Hyperglycemia 2 protocol  NEUROLOGIC A:   Acute encephalopathy 2nd to intention overdose >> some improvement 5/22. P:   Monitor mental  status Will need psych assessment when medically stable   CC time 32 minutes.  Coralyn Helling, MD [facility] Pulmonary/Critical Care 08/30/2015, 10:08 AM Pager:  [phone] After 3pm call: [phone]

## 2015-08-30 NOTE — Progress Notes (Signed)
eLink Physician-Brief Progress Note Patient Name: Debbora PrestoBrenda Cassels DOB: 1961-01-10 MRN: [id number]   Date of Service  08/30/2015  HPI/Events of Note  Multiple issues: 1. Oliguria - CVP = 30,  2. Blood glucose = 280. Likely d/t glucagon IV infusion. 3. HR still = 44 and 4. Vomited.   eICU Interventions  Will order: 1. Lasix 40 mg IV now.  2. Insulin IV infusion (not high dose). 3. NGT to LIS.        Franklyn Cafaro Eugene 08/30/2015, 7:12 PM

## 2015-08-31 ENCOUNTER — Inpatient Hospital Stay (HOSPITAL_COMMUNITY): Payer: Managed Care, Other (non HMO)

## 2015-08-31 ENCOUNTER — Encounter (HOSPITAL_COMMUNITY): Payer: Self-pay | Admitting: Pulmonary Disease

## 2015-08-31 DIAGNOSIS — R6521 Severe sepsis with septic shock: Secondary | ICD-10-CM

## 2015-08-31 DIAGNOSIS — N179 Acute kidney failure, unspecified: Secondary | ICD-10-CM | POA: Insufficient documentation

## 2015-08-31 DIAGNOSIS — J80 Acute respiratory distress syndrome: Secondary | ICD-10-CM

## 2015-08-31 DIAGNOSIS — A419 Sepsis, unspecified organism: Secondary | ICD-10-CM

## 2015-08-31 DIAGNOSIS — R0600 Dyspnea, unspecified: Secondary | ICD-10-CM

## 2015-08-31 DIAGNOSIS — J189 Pneumonia, unspecified organism: Secondary | ICD-10-CM

## 2015-08-31 LAB — RENAL FUNCTION PANEL
ANION GAP: 10 (ref 5–15)
Albumin: 2.5 g/dL — ABNORMAL LOW (ref 3.5–5.0)
BUN: 39 mg/dL — ABNORMAL HIGH (ref 6–20)
CALCIUM: 7.5 mg/dL — AB (ref 8.9–10.3)
CHLORIDE: 111 mmol/L (ref 101–111)
CO2: 12 mmol/L — AB (ref 22–32)
CREATININE: 3.92 mg/dL — AB (ref 0.44–1.00)
GFR calc Af Amer: 14 mL/min — ABNORMAL LOW (ref >60)
GFR calc non Af Amer: 12 mL/min — ABNORMAL LOW (ref >60)
GLUCOSE: 99 mg/dL (ref 65–99)
Phosphorus: 5.9 mg/dL — ABNORMAL HIGH (ref 2.5–4.6)
Potassium: 7.2 mmol/L (ref 3.5–5.1)
SODIUM: 133 mmol/L — AB (ref 135–145)

## 2015-08-31 LAB — GLUCOSE, CAPILLARY
GLUCOSE-CAPILLARY: 322 mg/dL — AB (ref 65–99)
GLUCOSE-CAPILLARY: 372 mg/dL — AB (ref 65–99)
GLUCOSE-CAPILLARY: 270 mg/dL — AB (ref 65–99)
GLUCOSE-CAPILLARY: 246 mg/dL — AB (ref 65–99)
Glucose-Capillary: 297 mg/dL — ABNORMAL HIGH (ref 65–99)
Glucose-Capillary: 298 mg/dL — ABNORMAL HIGH (ref 65–99)
Glucose-Capillary: 298 mg/dL — ABNORMAL HIGH (ref 65–99)
Glucose-Capillary: 310 mg/dL — ABNORMAL HIGH (ref 65–99)
Glucose-Capillary: 331 mg/dL — ABNORMAL HIGH (ref 65–99)
Glucose-Capillary: 336 mg/dL — ABNORMAL HIGH (ref 65–99)
Glucose-Capillary: 358 mg/dL — ABNORMAL HIGH (ref 65–99)

## 2015-08-31 LAB — BLOOD GAS, ARTERIAL
ACID-BASE DEFICIT: 10.3 mmol/L — AB (ref 0.0–2.0)
ACID-BASE DEFICIT: 12.7 mmol/L — AB (ref 0.0–2.0)
Acid-base deficit: 12.4 mmol/L — ABNORMAL HIGH (ref 0.0–2.0)
BICARBONATE: 12.5 meq/L — AB (ref 20.0–24.0)
BICARBONATE: 14.3 meq/L — AB (ref 20.0–24.0)
Bicarbonate: 12.4 mEq/L — ABNORMAL LOW (ref 20.0–24.0)
Drawn by: 232811
Drawn by: 103701
Drawn by: 295031
FIO2: 0.9
FIO2: 0.9
FIO2: 0.9
LHR: 32 {breaths}/min
MECHVT: 530 mL
MECHVT: 460 mL
O2 SAT: 87.4 %
O2 Saturation: 91.5 %
O2 Saturation: 74.4 %
PATIENT TEMPERATURE: 99.4
PATIENT TEMPERATURE: 98.6
PATIENT TEMPERATURE: 98.6
PCO2 ART: 27.5 mmHg — AB (ref 35.0–45.0)
PCO2 ART: 29.1 mmHg — AB (ref 35.0–45.0)
PCO2 ART: 26.9 mmHg — AB (ref 35.0–45.0)
PEEP/CPAP: 16 cmH2O
PEEP/CPAP: 16 cmH2O
PEEP: 14 cmH2O
PH ART: 7.313 — AB (ref 7.350–7.450)
PH ART: 7.285 — AB (ref 7.350–7.450)
PO2 ART: 70.6 mmHg — AB (ref 80.0–100.0)
PO2 ART: 59.2 mmHg — AB (ref 80.0–100.0)
PO2 ART: 46.2 mmHg — AB (ref 80.0–100.0)
RATE: 20 resp/min
RATE: 32 resp/min
TCO2: 11.5 mmol/L (ref 0–100)
TCO2: 13.2 mmol/L (ref 0–100)
TCO2: 11.4 mmol/L (ref 0–100)
VT: 460 mL
pH, Arterial: 7.284 — ABNORMAL LOW (ref 7.350–7.450)

## 2015-08-31 LAB — CBC
HCT: 38.3 % (ref 36.0–46.0)
HEMOGLOBIN: 13 g/dL (ref 12.0–15.0)
MCH: 29.7 pg (ref 26.0–34.0)
MCHC: 33.9 g/dL (ref 30.0–36.0)
MCV: 87.4 fL (ref 78.0–100.0)
PLATELETS: 355 10*3/uL (ref 150–400)
RBC: 4.38 MIL/uL (ref 3.87–5.11)
RDW: 13.7 % (ref 11.5–15.5)
WBC: 26.5 10*3/uL — AB (ref 4.0–10.5)

## 2015-08-31 LAB — MAGNESIUM: Magnesium: 1.7 mg/dL (ref 1.7–2.4)

## 2015-08-31 LAB — PROCALCITONIN: PROCALCITONIN: 135.86 ng/mL

## 2015-08-31 LAB — LACTIC ACID, PLASMA
LACTIC ACID, VENOUS: 2.5 mmol/L — AB (ref 0.5–2.0)
Lactic Acid, Venous: 3.4 mmol/L (ref 0.5–2.0)
Lactic Acid, Venous: 4.1 mmol/L (ref 0.5–2.0)

## 2015-08-31 LAB — BASIC METABOLIC PANEL
ANION GAP: 10 (ref 5–15)
BUN: 31 mg/dL — ABNORMAL HIGH (ref 6–20)
CO2: 15 mmol/L — ABNORMAL LOW (ref 22–32)
Calcium: 7.7 mg/dL — ABNORMAL LOW (ref 8.9–10.3)
Chloride: 107 mmol/L (ref 101–111)
Creatinine, Ser: 3.03 mg/dL — ABNORMAL HIGH (ref 0.44–1.00)
GFR calc Af Amer: 19 mL/min — ABNORMAL LOW (ref >60)
GFR, EST NON AFRICAN AMERICAN: 16 mL/min — AB (ref >60)
GLUCOSE: 329 mg/dL — AB (ref 65–99)
Potassium: 4.3 mmol/L (ref 3.5–5.1)
Sodium: 132 mmol/L — ABNORMAL LOW (ref 135–145)

## 2015-08-31 LAB — ECHOCARDIOGRAM COMPLETE
HEIGHTINCHES: 69 in
Weight: 5654.36 oz

## 2015-08-31 LAB — PHOSPHORUS: Phosphorus: 3.7 mg/dL (ref 2.5–4.6)

## 2015-08-31 LAB — CORTISOL: Cortisol, Plasma: 41.9 ug/dL

## 2015-08-31 LAB — TRIGLYCERIDES: Triglycerides: 99 mg/dL (ref <150)

## 2015-08-31 MED ORDER — VITAL HIGH PROTEIN PO LIQD
1000.0000 mL | ORAL | Status: DC
  Filled 2015-08-31: qty 1000

## 2015-08-31 MED ORDER — SODIUM CHLORIDE 0.9 % IV SOLN
INTRAVENOUS | Status: AC
  Filled 2015-08-31: qty 10

## 2015-08-31 MED ORDER — HEPARIN SOD (PORK) LOCK FLUSH 100 UNIT/ML IV SOLN
500.0000 [IU] | Freq: Once | INTRAVENOUS | Status: DC
  Filled 2015-08-31: qty 5

## 2015-08-31 MED ORDER — PRISMASOL BGK 4/2.5 32-4-2.5 MEQ/L IV SOLN
INTRAVENOUS | Status: DC
  Administered 2015-08-31: 16:00:00 via INTRAVENOUS_CENTRAL
  Filled 2015-08-31 (×2): qty 5000

## 2015-08-31 MED ORDER — HYDROCORTISONE NA SUCCINATE PF 100 MG IJ SOLR
50.0000 mg | Freq: Four times a day (QID) | INTRAMUSCULAR | Status: DC
  Administered 2015-08-31: 50 mg via INTRAVENOUS
  Filled 2015-08-31 (×2): qty 2

## 2015-08-31 MED ORDER — PRISMASOL BGK 4/2.5 32-4-2.5 MEQ/L IV SOLN
INTRAVENOUS | Status: DC
  Administered 2015-08-31: 16:00:00 via INTRAVENOUS_CENTRAL
  Filled 2015-08-31 (×3): qty 5000

## 2015-08-31 MED ORDER — VANCOMYCIN HCL 10 G IV SOLR
2500.0000 mg | Freq: Once | INTRAVENOUS | Status: AC
  Administered 2015-08-31: 2500 mg via INTRAVENOUS
  Filled 2015-08-31: qty 2500

## 2015-08-31 MED ORDER — EPINEPHRINE HCL 0.1 MG/ML IJ SOSY
PREFILLED_SYRINGE | INTRAMUSCULAR | Status: AC
  Filled 2015-08-31: qty 10

## 2015-08-31 MED ORDER — SENNOSIDES 8.8 MG/5ML PO SYRP
5.0000 mL | ORAL_SOLUTION | Freq: Two times a day (BID) | ORAL | Status: DC | PRN
Start: 2015-08-31 — End: 2015-09-01
  Filled 2015-08-31: qty 5

## 2015-08-31 MED ORDER — BISACODYL 10 MG RE SUPP: 10.0000 mg | Freq: Every day | RECTAL | Status: DC | PRN

## 2015-08-31 MED ORDER — PIPERACILLIN-TAZOBACTAM 3.375 G IVPB 30 MIN
3.3750 g | Freq: Four times a day (QID) | INTRAVENOUS | Status: DC
  Filled 2015-08-31 (×3): qty 50

## 2015-08-31 MED ORDER — HEPARIN SODIUM (PORCINE) 1000 UNIT/ML IJ SOLN
1000.0000 [IU] | INTRAMUSCULAR | Status: DC | PRN
  Filled 2015-08-31: qty 6

## 2015-08-31 MED ORDER — PERFLUTREN LIPID MICROSPHERE
1.0000 mL | INTRAVENOUS | Status: AC | PRN
  Administered 2015-08-31: 2 mL via INTRAVENOUS
  Filled 2015-08-31: qty 10

## 2015-08-31 MED ORDER — VANCOMYCIN HCL 10 G IV SOLR: 2000.0000 mg | INTRAVENOUS | Status: DC

## 2015-08-31 MED ORDER — HEPARIN SODIUM (PORCINE) 1000 UNIT/ML DIALYSIS
1000.0000 [IU] | INTRAMUSCULAR | Status: DC | PRN
  Filled 2015-08-31: qty 6

## 2015-08-31 MED ORDER — VITAL HIGH PROTEIN PO LIQD
1000.0000 mL | ORAL | Status: DC
  Administered 2015-08-31: 1000 mL
  Filled 2015-08-31 (×2): qty 1000

## 2015-08-31 MED ORDER — FENTANYL BOLUS VIA INFUSION
50.0000 ug | INTRAVENOUS | Status: DC | PRN
  Filled 2015-08-31: qty 50

## 2015-08-31 MED ORDER — VANCOMYCIN HCL 10 G IV SOLR: 1500.0000 mg | INTRAVENOUS | Status: DC

## 2015-08-31 MED ORDER — PRO-STAT SUGAR FREE PO LIQD: 60.0000 mL | Freq: Two times a day (BID) | ORAL | Status: DC

## 2015-08-31 MED ORDER — SODIUM CHLORIDE 0.9 % IV SOLN
INTRAVENOUS | Status: DC | PRN
  Administered 2015-08-31: 16:00:00 via INTRA_ARTERIAL

## 2015-08-31 MED ORDER — SODIUM CHLORIDE 0.9 % FOR CRRT: INTRAVENOUS_CENTRAL | Status: DC | PRN

## 2015-08-31 MED ORDER — FENTANYL CITRATE (PF) 2500 MCG/50ML IJ SOLN
25.0000 ug/h | INTRAMUSCULAR | Status: DC
  Administered 2015-08-31: 50 ug/h via INTRAVENOUS
  Filled 2015-08-31: qty 50

## 2015-08-31 MED ORDER — SODIUM BICARBONATE 8.4 % IV SOLN
INTRAVENOUS | Status: DC
  Administered 2015-08-31: 10:00:00 via INTRAVENOUS
  Filled 2015-08-31: qty 50

## 2015-08-31 MED ORDER — PRISMASOL BGK 4/2.5 32-4-2.5 MEQ/L IV SOLN
INTRAVENOUS | Status: DC
  Administered 2015-08-31: 16:00:00 via INTRAVENOUS_CENTRAL
  Filled 2015-08-31 (×4): qty 5000

## 2015-08-31 MED ORDER — MIDAZOLAM HCL 2 MG/2ML IJ SOLN: 2.0000 mg | INTRAMUSCULAR | Status: DC | PRN

## 2015-08-31 MED ORDER — HEPARIN SODIUM (PORCINE) 5000 UNIT/ML IJ SOLN
5000.0000 [IU] | Freq: Three times a day (TID) | INTRAMUSCULAR | Status: DC
  Administered 2015-08-31: 5000 [IU] via SUBCUTANEOUS
  Filled 2015-08-31: qty 1

## 2015-08-31 MED ORDER — PROPOFOL 1000 MG/100ML IV EMUL
0.0000 ug/kg/min | INTRAVENOUS | Status: DC
  Administered 2015-08-31: 5 ug/kg/min via INTRAVENOUS
  Filled 2015-08-31 (×2): qty 100

## 2015-08-31 MED FILL — Medication: Qty: 1 | Status: AC

## 2015-09-01 LAB — GLUCOSE, CAPILLARY
GLUCOSE-CAPILLARY: 251 mg/dL — AB (ref 65–99)
GLUCOSE-CAPILLARY: 226 mg/dL — AB (ref 65–99)
GLUCOSE-CAPILLARY: 111 mg/dL — AB (ref 65–99)
GLUCOSE-CAPILLARY: 87 mg/dL (ref 65–99)
GLUCOSE-CAPILLARY: 73 mg/dL (ref 65–99)
Glucose-Capillary: 225 mg/dL — ABNORMAL HIGH (ref 65–99)
Glucose-Capillary: 177 mg/dL — ABNORMAL HIGH (ref 65–99)
Glucose-Capillary: 154 mg/dL — ABNORMAL HIGH (ref 65–99)
Glucose-Capillary: 80 mg/dL (ref 65–99)

## 2015-09-05 LAB — CULTURE, RESPIRATORY W GRAM STAIN

## 2015-09-05 LAB — CULTURE, RESPIRATORY

## 2015-09-09 NOTE — Progress Notes (Addendum)
PULMONARY / CRITICAL CARE MEDICINE   Name: Kimberly Mcintyre MRN: [id number] DOB: 1960/09/12    ADMISSION DATE:  08/28/2015  REFERRING MD:  Dr. Rubin Payor  CHIEF COMPLAINT:  Altered mental status.  SUBJECTIVE:  Back on insulin, glucagon.  Increased PEEP/FiO2 needs.  Elevated procalcitonin >> started Abx.  Not much urine outpt.  VITAL SIGNS: BP 134/41 mmHg  Pulse 50  Temp(Src) 99.1 F (37.3 C) (Axillary)  Resp 28  Ht  (1.753 m)  Wt 353 lb 6.4 oz (160.3 kg)  BMI 52.16 kg/m2  SpO2 92%  HEMODYNAMICS: CVP:  [5 mmHg-43 mmHg] 26 mmHg  VENTILATOR SETTINGS: Vent Mode:  [-] PRVC FiO2 (%):  [40 %-100 %] 90 % Set Rate:  [20 bmp] 20 bmp Vt Set:  [530 mL-560 mL] 530 mL PEEP:  [5 cmH20-14 cmH20] 14 cmH20 Pressure Support:  [10 cmH20] 10 cmH20 Plateau Pressure:  [9 cmH20-28 cmH20] 28 cmH20  INTAKE / OUTPUT: I/O last 3 completed shifts: In: 10295.3 [I.V.:6860.3; Other:40; IV Piggyback:3395] Out: 1241 [Urine:1016; Emesis/NG output:225]  PHYSICAL EXAMINATION: General: sedated Neuro: RASS -3 HEENT: ETT in place Cardiovascular:  Bradycardic, regular, no murmur Lungs: crackles b/l Rt > Lt Abdomen:  Soft, non tender Musculoskeletal:  1+ edema Skin:  No rashes  LABS:  BMET  Recent Labs Lab 08/30/15 1430 08/30/15 2128 09-22-2015 0220  NA 137 135 132*  K 2.7* 4.8 4.3  CL 110 109 107  CO2 17* 14* 15*  BUN 25* 29* 31*  CREATININE 1.64* 2.59* 3.03*  GLUCOSE 47* 358* 329*    Electrolytes  Recent Labs Lab 08/30/15 0120 08/30/15 1430 08/30/15 2128 22-Sep-2015 0220  CALCIUM 7.8* 7.9* 7.9* 7.7*  MG 1.6*  --   --  1.7  PHOS 1.1*  --   --  3.7    CBC  Recent Labs Lab 08/13/2015 1451 08/24/2015 1504 08/30/15 0120 22-Sep-2015 0220  WBC 10.4  --  15.0* 26.5*  HGB 13.8 15.3* 13.2 13.0  HCT 41.3 45.0 40.3 38.3  PLT 299  --  274 355    Coag's No results for input(s): APTT, INR in the last 168 hours.  Sepsis Markers  Recent Labs Lab 08/30/15 1430 08/30/15 2330  09/22/2015 0203 22-Sep-2015 0220  LATICACIDVEN  --  4.1* 3.4*  --   PROCALCITON 62.05  --   --  135.86    ABG  Recent Labs Lab 08/19/2015 1835 08/30/15 0518 22-Sep-2015 0520  PHART 7.265* 7.306* 7.284*  PCO2ART 40.6 37.7 27.5*  PO2ART 162* 112* 70.6*    Liver Enzymes  Recent Labs Lab 08/09/2015 1451 08/30/15 0120  AST 22 41  ALT 20 34  ALKPHOS 98 87  BILITOT 0.3 0.4  ALBUMIN 3.5 3.0*    Cardiac Enzymes No results for input(s): TROPONINI, PROBNP in the last 168 hours.  Glucose  Recent Labs Lab 2015/09/22 0114 2015-09-22 0215 09-22-2015 0315 09/22/15 0416 09/22/15 0512 22-Sep-2015 0605  GLUCAP 297* 336* 298* 310* 270* 246*    Imaging Dg Chest Port 1 View  Sep 22, 2015  CLINICAL DATA:  Acute onset of respiratory failure and renal failure. Initial encounter. EXAM: PORTABLE CHEST 1 VIEW COMPARISON:  Chest radiograph performed 08/30/2015 FINDINGS: The patient's endotracheal tube is seen ending 3 cm above the carina. An enteric tube is noted extending below the diaphragm. Right basilar airspace opacity may reflect pneumonia. A small right pleural effusion is suspected. No pneumothorax is seen. The cardiomediastinal silhouette is mildly enlarged. No acute osseous abnormalities are identified. A left IJ line is noted ending  about the mid SVC. An external pacing pad is noted. IMPRESSION: 1. Endotracheal tube seen ending 3 cm above the carina. 2. Right basilar airspace opacity may reflect pneumonia. Small right pleural effusion suspected. 3. Mild cardiomegaly. Electronically Signed   By: Roanna RaiderJeffery  Chang M.D.   On: 08/30/2015 01:30   Dg Chest Port 1 View  08/30/2015  CLINICAL DATA:  Encounter for central line placement. EXAM: PORTABLE CHEST 1 VIEW COMPARISON:  Same day. FINDINGS: Stable cardiomediastinal silhouette. Endotracheal nasogastric tubes are unchanged in position. No pneumothorax is noted. Mildly increased right basilar opacity is noted suggesting worsening atelectasis or infiltrate.  Interval placement of left internal jugular catheter with distal tip in expected position of the SVC. Bony thorax is unremarkable. IMPRESSION: Stable support apparatus. Interval placement of left internal jugular catheter with distal tip in expected position of the SVC. Increased right basilar opacity is noted concerning for worsening atelectasis or possibly pneumonia. Electronically Signed   By: Lupita RaiderJames  Green Jr, M.D.   On: 08/30/2015 14:20     STUDIES:  5/22 Echo >>  CULTURES: 5/22 Sputum >>  ANTIBIOTICS: 5/22 Zosyn >> 5/23 Vancomycin >>  SIGNIFICANT EVENTS: 5/21 Admit 5/22 Oliguria, septic shock, heart block when glucagon off, ARDS, consult renal  LINES/TUBES: 5/21 ETT >>  5/22 Lt IJ CVL >>  DISCUSSION: 55 yo with intentional overdose of verapamil, xanax, melatonin, zoloft.  ASSESSMENT / PLAN:  PULMONARY A: Acute hypoxic respiratory failure from PNA, ARDS. P:   ARDS protocol 5/23 F/u CXR, ABG  CARDIOVASCULAR A:  Bradycardia 2nd to overdose. Hypotension 2nd to bradycardia and hypovolemia and septic shock. P:  Continue glucagon, insulin gtt for now Continue levophed to keep MAP > 65 D/c dopamine >> not making significant difference with heart rate Place aline Check cortisol and start solu cortef pending results Check Echo  RENAL A:   AKI. Gap/non gap metabolic acidosis. Hypokalemia, hypomagnesemia, hypophosphatemia. Lactic acidosis. P:   Monitor renal fx, urine outpt Replace electrolytes Add HCO3 to IV fluid F/u BMET, ABG, lactic acid Consult renal >> concerned she will need CRRT  GASTROINTESTINAL A:   Nutrition. P:   Tube feeds Protonix for SUP  HEMATOLOGIC A:   Leukocytosis. Jehovah's Witness >> no blood transfusions. P:  F/u CBC SQ heparin for DVT prophylaxis  INFECTIOUS A:   Septic shock 2nd to pneumonia. P:   Day 2 of Abx >> continue zosyn and add vancomycin pending cx results  ENDOCRINE A:   Hyperglycemia.   P:   Insulin gtt  while on glucagon  NEUROLOGIC A:   Acute encephalopathy 2nd to intention overdose, septic shock. P:   RASS goal -3 to -4 while in ARDS Will need psych assessment when medically stable   CC time 47 minutes.  Coralyn HellingVineet Indiana Gamero, MD [facility] Pulmonary/Critical Care 08/24/2015, 8:29 AM Pager:  [phone] After 3pm call: [phone]   Spoke with pt's son.  Updated about pt's status.  Son informed me that pt's if Jehovah's Witness >> no blood transfusions.  Coralyn HellingVineet Nichalos Brenton, MD [facility] Pulmonary/Critical Care 08/25/2015, 10:20 AM Pager:  [phone] After 3pm call: [phone]

## 2015-09-09 NOTE — Progress Notes (Signed)
CRITICAL VALUE ALERT  Critical value received:  LA 4.1  Date of notification:  09/01/2015  Time of notification:  0010  Critical value read back:Yes.    Nurse who received alert:  Rutha BouchardShelby Charnese Federici, RN  MD notified (1st page):  Dr. Vassie LollAlva  Time of first page:  0022  MD notified (2nd page):  Time of second page:  Responding MD:  Dr. Vassie LollAlva  Time MD responded:  [phone]

## 2015-09-09 NOTE — Procedures (Signed)
Arterial Catheter Insertion Procedure Note Kimberly PrestoBrenda Mcintyre [id number] [birth year]  Procedure: Insertion of Arterial Catheter  Indications: Blood pressure monitoring and Frequent blood sampling  Procedure Details Consent: Risks of procedure as well as the alternatives and risks of each were explained to the (patient/caregiver).  Consent for procedure obtained. Time Out: Verified patient identification, verified procedure, site/side was marked, verified correct patient position, special equipment/implants available, medications/allergies/relevent history reviewed, required imaging and test results available.  Performed  Maximum sterile technique was used including antiseptics, cap, gloves, gown, hand hygiene, mask and sheet. Skin prep: Chlorhexidine; local anesthetic administered 20 gauge catheter was inserted into left radial artery using the Seldinger technique.  Evaluation Blood flow good; BP tracing good. Complications: No apparent complications.   Shelby Mattocksete E Foster Sonnier 08/12/2015

## 2015-09-09 NOTE — Code Documentation (Signed)
Patient developed bradycardia and then asystole.  CPR immediately started.  She was given multiple rounds of epi.  She was given HCO3 and calcium.  Resuscitative efforts were unsuccessful, and patient expired.  Informed patient's sons at bedside.  Coralyn HellingVineet Kaylaann Mountz, MD [facility] Pulmonary/Critical Care 09/07/2015, 5:16 PM Pager:  [phone] After 3pm call: [phone]

## 2015-09-09 NOTE — Progress Notes (Addendum)
Nutrition Follow-up  DOCUMENTATION CODES:   Morbid obesity  INTERVENTION:  - Will change TF regimen: Vital High Protein @ 50 mL/hr with 60 mL Prostat BID which (with kcal from Propofol) will provide 1980 kcal, 165 grams of protein, and 1003 mL free water. - Free water flush per MD/NP - RD will follow-up 5/24 to adjust TF regimen if needed with initiation of CRRT  NUTRITION DIAGNOSIS:   Inadequate oral intake related to inability to eat as evidenced by NPO status. -ongoing  GOAL:   Provide needs based on ASPEN/SCCM guidelines -will be met with TF at goal rate  MONITOR:   Vent status, TF tolerance, Weight trends, Labs, I & O's  REASON FOR ASSESSMENT:   Consult Enteral/tube feeding initiation and management  ASSESSMENT:   [place] parking [place]. Told EMS that she took 212 mg verapamil, 240 mg xanax, zoloft, and whole bottle of melatonin. She told EMS she can't sleep, and was trying to take medicine to help sleep. She later said she wanted to kill herself because she had too much stress taking care of her mother. Her mental status became progressively worse, and she required intubation for airway protection in the ER. She had bradycardia. She was started on glucagon with improvement in heart rhythm/rate and blood pressure.  5/23 Consult received yesterday afternoon and this AM for TF management. Vital High Protein @ goal rate of 60 mL/hr with 30 mL Prostat TID ordered yesterday afternoon; this regimen to provide 1740 kcal, 171 grams of protein, and 1204 mL free water. Current order in place: Vital High Protein @ 40 mL/hr with 30 mL Prostat TID which provides 1260 kcal (75% minimum estimated kcal needs), 129 grams of protein (78% minimum estimated protein needs), and 802 mL free water.   Procedure being performed at bedside at this time (central venous HD catheter placement for CRRT) so unable to confirm vent settings; will do so later this AM. No notes indicating  intolerance to TF at this time.  Not meeting >/=90% estimated needs. Medications reviewed; 50 mg every 6 hours IV Solu-Cortef, 40 mg every 24 hours Protonix, 3.375 g every 8 hours IV Zosyn. Labs reviewed; CBGs this AM: 246-336 mg/dL, Na: 132 mmol/L, BUN/creatinine elevated, Ca: 7.7 mg/dL, GFR: 16.  IVF: D5-50 mEq sodium bicard @ 50 mL/hr (204 kcal). Drips:  Fentanyl @ 50 mcg/hr, insulin @ 24.8 units/hr, Neo @ 15 mcg/min.  ADDENDUM: Patient is currently intubated on ventilator support MV: 14.1 L/min Temp (24hrs), Avg:98.4 F (36.9 C), Min:97.8 F (36.6 C), Max:99.2 F (37.3 C) Propofol: 14.4 ml/hr (380 kcal)   **Will change TF as outlined above related to this update    5/22 - No family/visitors present to provide information from PTA.  - Pt with suicide attempt PTA and sitter at bedside.  - Pt was intubated 5/21 around 1600.  - Patient is currently intubated on ventilator support with NGT in place; MV: 14.3 L/min; Temp (24hrs) Max:97.9 F (36.6 C); Propofol: none - Unable to complete physical assessment at this time with bilateral mitten restraints in place; no visual wasting present.  - Per chart review, pt has gained 16 lbs in the past 7-8 months.  Diet Order:  Diet NPO time specified  Skin:  Reviewed, no issues  Last BM:  PTA  Height:   Ht Readings from Last 1 Encounters:  08/30/15 _0  (1.753 m)    Weight:   Wt Readings from Last 1 Encounters:  09-25-2015 353 lb 6.4 oz (  160.3 kg)    Ideal Body Weight:  65.91 kg (kg)  BMI:  Body mass index is 52.16 kg/(m^2).  Estimated Nutritional Needs:   Kcal:  8871-9597 (11-13 kcal/kg acutal weight)  Protein:  165 grams (2.5 grams/kg IBW)  Fluid:  2 L/day  EDUCATION NEEDS:   No education needs identified at this time     Jarome Matin, RD, LDN Inpatient Clinical Dietitian Pager # [phone] After hours/weekend pager # [phone]

## 2015-09-09 NOTE — Progress Notes (Addendum)
Pharmacy Antibiotic Note  Kimberly PrestoBrenda Mcintyre is a 55 y.o. female admitted on 08/17/2015 with intentional overdose with verapamil, xanax, melatonin, and zoloft .  Pharmacy has been consulted for Zosyn dosing for aspiration PNA.  Noted AKI.    Today, 08-24-2015: Adding vancomycin with GPCs in trach aspirate Worsening leukocytosis (no steroids), Temps [place] Worsening AKI  Plan: Vancomycin 2500 mg IV now, then 2000 mg IV q48 hr; goal trough 15-20 mcg/mL Vancomycin trough as needed.  If SCr continues to worsen, will check random level in 48 hr  Continue Zosyn every 8 hrs by 4-hr infusion  F/u cultures, clinical course  Height: 5\' 9"  (175.3 cm) Weight: (!) 353 lb 6.4 oz (160.3 kg) IBW/kg (Calculated) : 66.2  Temp (24hrs), Avg:98.4 F (36.9 C), Min:97.8 F (36.6 C), Max:99.2 F (37.3 C)   Recent Labs Lab 08/14/2015 1451 08/10/2015 1504 08/30/15 0120 08/30/15 1430 08/30/15 2128 08/30/15 2330 Jul 18, 2015 0203 Jul 18, 2015 0220  WBC 10.4  --  15.0*  --   --   --   --  26.5*  CREATININE 0.94 0.80 1.38* 1.64* 2.59*  --   --  3.03*  LATICACIDVEN  --   --   --   --   --  4.1* 3.4*  --     Estimated Creatinine Clearance: 34 mL/min (by C-G formula based on Cr of 3.03).    Allergies  Allergen Reactions  . 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 Compazine [Prochlorperazine Edisylate] Anxiety  . Contrast Media [Iodinated Diagnostic Agents] Rash and Other (See Comments)    Dye for the CT contrast, causes heaviness in her chest    Antimicrobials this admission: 5/22 Zosyn >>  5/23 Vancomycin >>  Dose adjustments this admission: ---  Microbiology results: 5/22 Sputum: mod GPCs in pairs; rare GPRs and GNRs   5/21 MRSA PCR: Negative  Thank you for allowing pharmacy to be a part of this patient's care.  Bernadene Personrew Aala Ransom, PharmD, BCPS Pager: [phone] 08-24-2015, 9:25 AM    1600 ADDENDUM CRRT started; will adjust antibiotics as below:  Vancomycin 10 mg/kg q24  starting tomorrow (started CRRT 6 hrs after loading dose, so will treat as new start of vancomycin on CRRT)  Zosyn 3.375 mg IV q6 hr, given over 30 min  F/u for CRRT off times and hourly effluent rate  Bernadene Personrew Makala Fetterolf, PharmD, BCPS Pager: [phone] 08-24-2015, 4:07 PM

## 2015-09-09 NOTE — Progress Notes (Signed)
While setting up patient with CRRT patient HR decreased until patient was in asystole. Redgie GrayerPamela West, RN and Sharl MaBri McNabb, RN, along with Drue Stageraroline Jaryan Chicoine, RN Hydrographic surveyor(writer) were present at bedside at time of Code. Code preceded from 1644 until 1711 when the resuscitation event ended. Pt expired at 1711. Pulse was felt, and absence of heart sounds were auscultated for one full minute by Drue Stageraroline Bobbijo Holst RN and Redgie GrayerPamela West RN. MD and sons Tyrell and Nelma RothmanDaryl were present at bedside at time of death.

## 2015-09-09 NOTE — Progress Notes (Signed)
Discussed patient with Tamala Bariim McNeal at 2310 who states patient is a Adult nursemedical examiner case. She has gone to the morgue and the body bag will be sealed. There have been many family members and many questions through the night. Family in shock and at bedside.

## 2015-09-09 NOTE — Procedures (Signed)
Central Venous hemodialysis Catheter Insertion Procedure Note Kimberly Mcintyre [id number] [birth year]  Procedure: Insertion of Central Venous Catheter Indications: dialysis   Procedure Details Consent: Risks of procedure as well as the alternatives and risks of each were explained to the (patient/caregiver).  Consent for procedure obtained. Time Out: Verified patient identification, verified procedure, site/side was marked, verified correct patient position, special equipment/implants available, medications/allergies/relevent history reviewed, required imaging and test results available.  Performed Real time US was used to ID and cannulate the vessel  Maximum sterile technique was used including antiseptics, cap, gloves, gown, hand hygiene, mask and sheet. Skin prep: Chlorhexidine; local anesthetic administered A antimicrobial bonded/coated triple lumen catheter was placed in the right internal jugular vein using the Seldinger technique.  Evaluation Blood flow good Complications: No apparent complications Patient did tolerate procedure well. Chest X-ray ordered to verify placement.  CXR: pending.  Kimberly Mcintyre ACNP-BC [facility] Pulmonary/Critical Care Pager # [phone] OR # [phone] if no answer  Kimberly Mcintyre 08/22/2015, 10:41 AM

## 2015-09-09 NOTE — Progress Notes (Signed)
Night Chaplain paged to be with family in relief of daytime chaplain who was with the family during the Code Blue. Grief care and comfort provided. Chaplain remains with family as they gather to say their good-byes.  Benjie Karvonenharles D. Luisdavid Hamblin, DMin, MDiv Night Chaplain

## 2015-09-09 NOTE — Progress Notes (Signed)
  Echocardiogram 2D Echocardiogram has been performed.  Kimberly Mcintyre, Kimberly Mcintyre 2016/03/18, 1:45 PM

## 2015-09-09 NOTE — Progress Notes (Signed)
CRITICAL VALUE ALERT   Critical value received:  LA 3.4  Date of notification:  March 27, 2016  Time of notification:  0400  Critical value read back:Yes.    Nurse who received alert:  Rutha BouchardShelby Trini Christiansen, RN  MD notified (1st page):  Dr. Vassie LollAlva  Time of first page:  0401  MD notified (2nd page):  Time of second page:  Responding MD:  Dr. Vassie LollAlva  Time MD responded:  [phone]

## 2015-09-09 NOTE — Progress Notes (Signed)
Chaplain responding to code blue.   Provided support with family at bedside through code event, goals conversation with medical team.  Support with family at termination of code and patient death.   Transition to night coverage for continued support.     Belva CromeStalnaker, Zarion Oliff Wayne MDiv

## 2015-09-09 NOTE — Consult Note (Signed)
[facility] Renal Consultation Note  Requesting MD: Halford Chessman Indication for Consultation: AKI  HPI:  Kimberly Mcintyre is a 55 y.o. female with past medical history significant for hypertension, migraines, anxiety and depression who was brought to the emergency department on 5/21 after being found in a parking lot and had verbalized to EMS taking 212 mg of verapamil, 240 mg of Xanax, unknown amount of Zoloft and a whole bottle of melatonin.   She required intubation for airway protection, Started on glucagon for bradycardia and since has required an insulin drip. She's been exceedingly hypotensive with blood pressures as low as the 60s- and also bradycardic/ has required pressor administration and is felt to be developing ARDS. Her initial kidney function was normal at 0.9 but is increased steadily to now being over 3 this morning.  In addition, urine output dropping off - had only 261 mL yesterday and less than 200 so far today. Hemodialysis catheter placed by CCM today.    CREATININE, SER  Date/Time Value Ref Range Status  09/06/15 02:20 AM 3.03* 0.44 - 1.00 mg/dL Final  08/30/2015 09:28 PM 2.59* 0.44 - 1.00 mg/dL Final  08/30/2015 02:30 PM 1.64* 0.44 - 1.00 mg/dL Final  08/30/2015 01:20 AM 1.38* 0.44 - 1.00 mg/dL Final  08/30/2015 03:04 PM 0.80 0.44 - 1.00 mg/dL Final  08/10/2015 02:51 PM 0.94 0.44 - 1.00 mg/dL Final  06/16/2015 04:29 PM 0.98 0.44 - 1.00 mg/dL Final  03/18/2015 04:20 PM 0.88 0.44 - 1.00 mg/dL Final     PMHx:   Past Medical History  Diagnosis Date  . Allergy   . Hypertension   . Migraines   . Patient is Jehovah's Witness     Past Surgical History  Procedure Laterality Date  . Abdominal hysterectomy    . Tubal ligation      Family Hx:  Family History  Problem Relation Age of Onset  . Hypertension Mother     Social History:  reports that she has never smoked. She does not have any smokeless tobacco history on file. She reports that she drinks  alcohol. She reports that she does not use illicit drugs.  Allergies:  Allergies  Allergen Reactions  . 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 Compazine [Prochlorperazine Edisylate] Anxiety  . Contrast Media [Iodinated Diagnostic Agents] Rash and Other (See Comments)    Dye for the CT contrast, causes heaviness in her chest    Medications: Prior to Admission medications   Medication Sig Start Date End Date Taking? Authorizing Provider  ALPRAZolam Duanne Moron) 1 MG tablet Take 1 mg by mouth 3 (three) times daily as needed. For anxiety 08/23/15   Historical Provider, MD  aspirin-acetaminophen-caffeine (EXCEDRIN MIGRAINE) [phone] MG tablet Take 1 tablet by mouth every 6 (six) hours as needed for headache.    Historical Provider, MD  BIOTIN PO Take 1 tablet by mouth daily.    Historical Provider, MD  butalbital-aspirin-caffeine [facility]) 50-325-40 MG capsule Take 1 capsule by mouth 2 (two) times daily as needed for headache. 06/16/15   Carlisle Cater, PA-C  diphenhydrAMINE (BENADRYL) 25 MG tablet Take 100 mg by mouth at bedtime as needed for sleep.    Historical Provider, MD  famotidine (PEPCID) 10 MG tablet Take 10 mg by mouth 2 (two) times daily as needed for heartburn or indigestion.    Historical Provider, MD  Multiple Vitamins-Minerals (MULTIVITAMIN WITH MINERALS) tablet Take 1 tablet by mouth daily.    Historical Provider, MD  ondansetron (ZOFRAN-ODT) 8 MG disintegrating tablet Take 8 mg by mouth every 8 (eight) hours as needed. For nausea. 08/23/15   Historical Provider, MD  sertraline (ZOLOFT) 50 MG tablet Take 50 mg by mouth daily. 08/24/15   Historical Provider, MD  verapamil (VERELAN PM) 240 MG 24 hr capsule Take 1 capsule (240 mg total) by mouth daily. 06/16/15   Carlisle Cater, PA-C    I have reviewed the patient's current medications.  Labs:  Results for orders placed or performed during the hospital encounter of 09/01/2015 (from the past 48 hour(s))   Acetaminophen level     Status: Abnormal   Collection Time: 09/08/2015  2:51 PM  Result Value Ref Range   Acetaminophen (Tylenol), Serum <10 (L) 10 - 30 ug/mL    Comment:        THERAPEUTIC CONCENTRATIONS VARY SIGNIFICANTLY. A RANGE OF 10-30 ug/mL MAY BE AN EFFECTIVE CONCENTRATION FOR MANY PATIENTS. HOWEVER, SOME ARE BEST TREATED AT CONCENTRATIONS OUTSIDE THIS RANGE. ACETAMINOPHEN CONCENTRATIONS >150 ug/mL AT 4 HOURS AFTER INGESTION AND >50 ug/mL AT 12 HOURS AFTER INGESTION ARE OFTEN ASSOCIATED WITH TOXIC REACTIONS.   Comprehensive metabolic panel     Status: Abnormal   Collection Time: 09/06/2015  2:51 PM  Result Value Ref Range   Sodium 137 135 - 145 mmol/L   Potassium 4.2 3.5 - 5.1 mmol/L   Chloride 107 101 - 111 mmol/L   CO2 22 22 - 32 mmol/L   Glucose, Bld 164 (H) 65 - 99 mg/dL   BUN 13 6 - 20 mg/dL   Creatinine, Ser 0.94 0.44 - 1.00 mg/dL   Calcium 9.2 8.9 - 10.3 mg/dL   Total Protein 7.7 6.5 - 8.1 g/dL   Albumin 3.5 3.5 - 5.0 g/dL   AST 22 15 - 41 U/L   ALT 20 14 - 54 U/L   Alkaline Phosphatase 98 38 - 126 U/L   Total Bilirubin 0.3 0.3 - 1.2 mg/dL   GFR calc non Af Amer >60 >60 mL/min   GFR calc Af Amer >60 >60 mL/min    Comment: (NOTE) The eGFR has been calculated using the CKD EPI equation. This calculation has not been validated in all clinical situations. eGFR's persistently <60 mL/min signify possible Chronic Kidney Disease.    Anion gap 8 5 - 15  Salicylate level     Status: None   Collection Time: 08/21/2015  2:51 PM  Result Value Ref Range   Salicylate Lvl <7.2 2.8 - 30.0 mg/dL  Ethanol     Status: None   Collection Time: 08/28/2015  2:51 PM  Result Value Ref Range   Alcohol, Ethyl (B) <5 <5 mg/dL    Comment:        LOWEST DETECTABLE LIMIT FOR SERUM ALCOHOL IS 5 mg/dL FOR MEDICAL PURPOSES ONLY   CBC     Status: None   Collection Time: 08/22/2015  2:51 PM  Result Value Ref Range   WBC 10.4 4.0 - 10.5 K/uL   RBC 4.76 3.87 - 5.11 MIL/uL   Hemoglobin  13.8 12.0 - 15.0 g/dL   HCT 41.3 36.0 - 46.0 %   MCV 86.8 78.0 - 100.0 fL   MCH 29.0 26.0 - 34.0 pg   MCHC 33.4 30.0 - 36.0 g/dL   RDW 13.4 11.5 - 15.5 %   Platelets 299 150 - 400 K/uL  I-stat Chem 8, ED     Status: Abnormal   Collection Time: 08/26/2015  3:04 PM  Result Value Ref Range  Sodium 141 135 - 145 mmol/L   Potassium 4.4 3.5 - 5.1 mmol/L   Chloride 107 101 - 111 mmol/L   BUN 16 6 - 20 mg/dL   Creatinine, Ser 0.80 0.44 - 1.00 mg/dL   Glucose, Bld 163 (H) 65 - 99 mg/dL   Calcium, Ion 1.14 1.12 - 1.23 mmol/L   TCO2 25 0 - 100 mmol/L   Hemoglobin 15.3 (H) 12.0 - 15.0 g/dL   HCT 45.0 36.0 - 46.0 %  CBG monitoring, ED     Status: Abnormal   Collection Time: 09/06/2015  5:37 PM  Result Value Ref Range   Glucose-Capillary 312 (H) 65 - 99 mg/dL  Blood gas, arterial     Status: Abnormal   Collection Time: 08/18/2015  6:35 PM  Result Value Ref Range   FIO2 1.00    Delivery systems VENTILATOR    Mode PRESSURE REGULATED VOLUME CONTROL    VT 530 mL   LHR 20 resp/min   Peep/cpap 5.0 cm H20   pH, Arterial 7.265 (L) 7.350 - 7.450   pCO2 arterial 40.6 35.0 - 45.0 mmHg   pO2, Arterial 162 (H) 80.0 - 100.0 mmHg   Bicarbonate 17.8 (L) 20.0 - 24.0 mEq/L   TCO2 16.6 0 - 100 mmol/L   Acid-base deficit 8.2 (H) 0.0 - 2.0 mmol/L   O2 Saturation 98.7 %   Patient temperature 37.0    Collection site LEFT RADIAL    Drawn by [id number]    Sample type ARTERIAL DRAW    Allens test (pass/fail) PASS PASS  MRSA PCR Screening     Status: None   Collection Time: 08/21/2015  7:01 PM  Result Value Ref Range   MRSA by PCR NEGATIVE NEGATIVE    Comment:        The GeneXpert MRSA Assay (FDA approved for NASAL specimens only), is one component of a comprehensive MRSA colonization surveillance program. It is not intended to diagnose MRSA infection nor to guide or monitor treatment for MRSA infections.   Glucose, capillary     Status: Abnormal   Collection Time: 08/30/2015  7:14 PM  Result Value Ref Range    Glucose-Capillary 354 (H) 65 - 99 mg/dL  Glucose, capillary     Status: Abnormal   Collection Time: 09/07/2015  7:54 PM  Result Value Ref Range   Glucose-Capillary 334 (H) 65 - 99 mg/dL   Comment 1 Notify RN    Comment 2 Document in Chart   Glucose, capillary     Status: Abnormal   Collection Time: 08/27/2015  8:52 PM  Result Value Ref Range   Glucose-Capillary 302 (H) 65 - 99 mg/dL   Comment 1 Notify RN    Comment 2 Document in Chart   Glucose, capillary     Status: Abnormal   Collection Time: 08/28/2015  9:43 PM  Result Value Ref Range   Glucose-Capillary 315 (H) 65 - 99 mg/dL  Glucose, capillary     Status: Abnormal   Collection Time: 09/05/2015 10:43 PM  Result Value Ref Range   Glucose-Capillary 335 (H) 65 - 99 mg/dL   Comment 1 Notify RN    Comment 2 Document in Chart   Glucose, capillary     Status: Abnormal   Collection Time: 09/03/2015 11:15 PM  Result Value Ref Range   Glucose-Capillary 338 (H) 65 - 99 mg/dL  Glucose, capillary     Status: Abnormal   Collection Time: 08/21/2015 11:54 PM  Result Value Ref Range   Glucose-Capillary  372 (H) 65 - 99 mg/dL  Glucose, capillary     Status: Abnormal   Collection Time: 08/30/15 12:21 AM  Result Value Ref Range   Glucose-Capillary 405 (H) 65 - 99 mg/dL  Comprehensive metabolic panel     Status: Abnormal   Collection Time: 08/30/15  1:20 AM  Result Value Ref Range   Sodium 134 (L) 135 - 145 mmol/L    Comment: RESULT REPEATED AND VERIFIED DELTA CHECK NOTED    Potassium 3.7 3.5 - 5.1 mmol/L   Chloride 110 101 - 111 mmol/L   CO2 17 (L) 22 - 32 mmol/L   Glucose, Bld 398 (H) 65 - 99 mg/dL   BUN 18 6 - 20 mg/dL   Creatinine, Ser 1.38 (H) 0.44 - 1.00 mg/dL   Calcium 7.8 (L) 8.9 - 10.3 mg/dL   Total Protein 6.6 6.5 - 8.1 g/dL   Albumin 3.0 (L) 3.5 - 5.0 g/dL   AST 41 15 - 41 U/L   ALT 34 14 - 54 U/L   Alkaline Phosphatase 87 38 - 126 U/L   Total Bilirubin 0.4 0.3 - 1.2 mg/dL   GFR calc non Af Amer 42 (L) >60 mL/min   GFR calc Af  Amer 49 (L) >60 mL/min    Comment: (NOTE) The eGFR has been calculated using the CKD EPI equation. This calculation has not been validated in all clinical situations. eGFR's persistently <60 mL/min signify possible Chronic Kidney Disease.    Anion gap 7 5 - 15  CBC     Status: Abnormal   Collection Time: 08/30/15  1:20 AM  Result Value Ref Range   WBC 15.0 (H) 4.0 - 10.5 K/uL   RBC 4.48 3.87 - 5.11 MIL/uL   Hemoglobin 13.2 12.0 - 15.0 g/dL   HCT 40.3 36.0 - 46.0 %   MCV 90.0 78.0 - 100.0 fL   MCH 29.5 26.0 - 34.0 pg   MCHC 32.8 30.0 - 36.0 g/dL   RDW 13.8 11.5 - 15.5 %   Platelets 274 150 - 400 K/uL  Magnesium     Status: Abnormal   Collection Time: 08/30/15  1:20 AM  Result Value Ref Range   Magnesium 1.6 (L) 1.7 - 2.4 mg/dL  Phosphorus     Status: Abnormal   Collection Time: 08/30/15  1:20 AM  Result Value Ref Range   Phosphorus 1.1 (L) 2.5 - 4.6 mg/dL  Glucose, capillary     Status: Abnormal   Collection Time: 08/30/15  1:26 AM  Result Value Ref Range   Glucose-Capillary 368 (H) 65 - 99 mg/dL  Glucose, capillary     Status: Abnormal   Collection Time: 08/30/15  2:21 AM  Result Value Ref Range   Glucose-Capillary 369 (H) 65 - 99 mg/dL  Glucose, capillary     Status: Abnormal   Collection Time: 08/30/15  3:26 AM  Result Value Ref Range   Glucose-Capillary 333 (H) 65 - 99 mg/dL  Glucose, capillary     Status: Abnormal   Collection Time: 08/30/15  4:25 AM  Result Value Ref Range   Glucose-Capillary 322 (H) 65 - 99 mg/dL  Blood gas, arterial     Status: Abnormal   Collection Time: 08/30/15  5:18 AM  Result Value Ref Range   FIO2 0.80    Delivery systems VENTILATOR    Mode PRESSURE REGULATED VOLUME CONTROL    VT 530 mL   LHR 20 resp/min   Peep/cpap 5.0 cm H20   pH, Arterial 7.306 (  L) 7.350 - 7.450   pCO2 arterial 37.7 35.0 - 45.0 mmHg   pO2, Arterial 112 (H) 80.0 - 100.0 mmHg   Bicarbonate 18.4 (L) 20.0 - 24.0 mEq/L   TCO2 16.9 0 - 100 mmol/L   Acid-base deficit  7.0 (H) 0.0 - 2.0 mmol/L   O2 Saturation 97.9 %   Patient temperature 97.7    Collection site RIGHT RADIAL    Drawn by [id number]    Sample type ARTERIAL DRAW    Allens test (pass/fail) PASS PASS  Glucose, capillary     Status: Abnormal   Collection Time: 08/30/15  5:25 AM  Result Value Ref Range   Glucose-Capillary 287 (H) 65 - 99 mg/dL  Glucose, capillary     Status: Abnormal   Collection Time: 08/30/15  6:18 AM  Result Value Ref Range   Glucose-Capillary 276 (H) 65 - 99 mg/dL  Glucose, capillary     Status: Abnormal   Collection Time: 08/30/15  7:20 AM  Result Value Ref Range   Glucose-Capillary 241 (H) 65 - 99 mg/dL  Glucose, capillary     Status: Abnormal   Collection Time: 08/30/15  8:17 AM  Result Value Ref Range   Glucose-Capillary 240 (H) 65 - 99 mg/dL  Glucose, capillary     Status: Abnormal   Collection Time: 08/30/15  9:20 AM  Result Value Ref Range   Glucose-Capillary 211 (H) 65 - 99 mg/dL  Glucose, capillary     Status: Abnormal   Collection Time: 08/30/15 10:10 AM  Result Value Ref Range   Glucose-Capillary 187 (H) 65 - 99 mg/dL  Glucose, capillary     Status: Abnormal   Collection Time: 08/30/15 10:47 AM  Result Value Ref Range   Glucose-Capillary 147 (H) 65 - 99 mg/dL  Glucose, capillary     Status: None   Collection Time: 08/30/15 11:48 AM  Result Value Ref Range   Glucose-Capillary 84 65 - 99 mg/dL  Glucose, capillary     Status: Abnormal   Collection Time: 08/30/15 12:47 PM  Result Value Ref Range   Glucose-Capillary 37 (LL) 65 - 99 mg/dL   Comment 1 Notify RN   Glucose, capillary     Status: None   Collection Time: 08/30/15  1:05 PM  Result Value Ref Range   Glucose-Capillary 80 65 - 99 mg/dL  Glucose, capillary     Status: Abnormal   Collection Time: 08/30/15  2:14 PM  Result Value Ref Range   Glucose-Capillary 54 (L) 65 - 99 mg/dL  Basic metabolic panel     Status: Abnormal   Collection Time: 08/30/15  2:30 PM  Result Value Ref Range   Sodium  137 135 - 145 mmol/L   Potassium 2.7 (LL) 3.5 - 5.1 mmol/L    Comment: DELTA CHECK NOTED CRITICAL RESULT CALLED TO, READ BACK BY AND VERIFIED WITH: MCNABB,B @ 1541 ON [id number] BY POTEAT,S    Chloride 110 101 - 111 mmol/L   CO2 17 (L) 22 - 32 mmol/L   Glucose, Bld 47 (L) 65 - 99 mg/dL   BUN 25 (H) 6 - 20 mg/dL   Creatinine, Ser 1.64 (H) 0.44 - 1.00 mg/dL   Calcium 7.9 (L) 8.9 - 10.3 mg/dL   GFR calc non Af Amer 34 (L) >60 mL/min   GFR calc Af Amer 39 (L) >60 mL/min    Comment: (NOTE) The eGFR has been calculated using the CKD EPI equation. This calculation has not been validated in all clinical situations. eGFR's  persistently <60 mL/min signify possible Chronic Kidney Disease.    Anion gap 10 5 - 15  Procalcitonin - Baseline     Status: None   Collection Time: 08/30/15  2:30 PM  Result Value Ref Range   Procalcitonin 62.05 ng/mL    Comment:        Interpretation: PCT >= 10 ng/mL: Important systemic inflammatory response, almost exclusively due to severe bacterial sepsis or septic shock. (NOTE)         ICU PCT Algorithm               Non ICU PCT Algorithm    ----------------------------     ------------------------------         PCT < 0.25 ng/mL                 PCT < 0.1 ng/mL     Stopping of antibiotics            Stopping of antibiotics       strongly encouraged.               strongly encouraged.    ----------------------------     ------------------------------       PCT level decrease by               PCT < 0.25 ng/mL       >= 80% from peak PCT       OR PCT 0.25 - 0.5 ng/mL          Stopping of antibiotics                                             encouraged.     Stopping of antibiotics           encouraged.    ----------------------------     ------------------------------       PCT level decrease by              PCT >= 0.25 ng/mL       < 80% from peak PCT        AND PCT >= 0.5 ng/mL             Continuing antibiotics                                               encouraged.       Continuing antibiotics            encouraged.    ----------------------------     ------------------------------     PCT level increase compared          PCT > 0.5 ng/mL         with peak PCT AND          PCT >= 0.5 ng/mL             Escalation of antibiotics                                          strongly encouraged.      Escalation of antibiotics        strongly encouraged.   Glucose, capillary  Status: Abnormal   Collection Time: 08/30/15  2:37 PM  Result Value Ref Range   Glucose-Capillary 110 (H) 65 - 99 mg/dL  Glucose, capillary     Status: Abnormal   Collection Time: 08/30/15  3:14 PM  Result Value Ref Range   Glucose-Capillary 110 (H) 65 - 99 mg/dL  Culture, respiratory (NON-Expectorated)     Status: None (Preliminary result)   Collection Time: 08/30/15  3:30 PM  Result Value Ref Range   Specimen Description TRACHEAL ASPIRATE    Special Requests NONE    Gram Stain      ABUNDANT WBC PRESENT, PREDOMINANTLY PMN FEW SQUAMOUS EPITHELIAL CELLS PRESENT MODERATE GRAM POSITIVE COCCI IN PAIRS RARE GRAM POSITIVE RODS RARE GRAM NEGATIVE RODS Performed at [facility]    Culture NO GROWTH Performed at [facility]     Report Status PENDING   Glucose, capillary     Status: Abnormal   Collection Time: 08/30/15  4:57 PM  Result Value Ref Range   Glucose-Capillary 175 (H) 65 - 99 mg/dL  Glucose, capillary     Status: Abnormal   Collection Time: 08/30/15  6:05 PM  Result Value Ref Range   Glucose-Capillary 280 (H) 65 - 99 mg/dL  Glucose, capillary     Status: Abnormal   Collection Time: 08/30/15  7:02 PM  Result Value Ref Range   Glucose-Capillary 288 (H) 65 - 99 mg/dL  Glucose, capillary     Status: Abnormal   Collection Time: 08/30/15  8:05 PM  Result Value Ref Range   Glucose-Capillary 298 (H) 65 - 99 mg/dL  Glucose, capillary     Status: Abnormal   Collection Time: 08/30/15  9:08 PM  Result Value Ref Range   Glucose-Capillary 322  (H) 65 - 99 mg/dL  Basic metabolic panel     Status: Abnormal   Collection Time: 08/30/15  9:28 PM  Result Value Ref Range   Sodium 135 135 - 145 mmol/L   Potassium 4.8 3.5 - 5.1 mmol/L    Comment: DELTA CHECK NOTED REPEATED TO VERIFY NO VISIBLE HEMOLYSIS    Chloride 109 101 - 111 mmol/L   CO2 14 (L) 22 - 32 mmol/L   Glucose, Bld 358 (H) 65 - 99 mg/dL   BUN 29 (H) 6 - 20 mg/dL   Creatinine, Ser 2.59 (H) 0.44 - 1.00 mg/dL   Calcium 7.9 (L) 8.9 - 10.3 mg/dL   GFR calc non Af Amer 20 (L) >60 mL/min   GFR calc Af Amer 23 (L) >60 mL/min    Comment: (NOTE) The eGFR has been calculated using the CKD EPI equation. This calculation has not been validated in all clinical situations. eGFR's persistently <60 mL/min signify possible Chronic Kidney Disease.    Anion gap 12 5 - 15  Glucose, capillary     Status: Abnormal   Collection Time: 08/30/15 10:11 PM  Result Value Ref Range   Glucose-Capillary 372 (H) 65 - 99 mg/dL  Glucose, capillary     Status: Abnormal   Collection Time: 08/30/15 11:14 PM  Result Value Ref Range   Glucose-Capillary 358 (H) 65 - 99 mg/dL  Lactic acid, plasma     Status: Abnormal   Collection Time: 08/30/15 11:30 PM  Result Value Ref Range   Lactic Acid, Venous 4.1 (HH) 0.5 - 2.0 mmol/L    Comment: CRITICAL RESULT CALLED TO, READ BACK BY AND VERIFIED WITH: S.ODOM,RN AT 0006 ON 09/14/15 BY W.SHEA   Glucose, capillary  Status: Abnormal   Collection Time: September 19, 2015 12:08 AM  Result Value Ref Range   Glucose-Capillary 331 (H) 65 - 99 mg/dL  Glucose, capillary     Status: Abnormal   Collection Time: 19-Sep-2015  1:14 AM  Result Value Ref Range   Glucose-Capillary 297 (H) 65 - 99 mg/dL  Lactic acid, plasma     Status: Abnormal   Collection Time: 2015-09-19  2:03 AM  Result Value Ref Range   Lactic Acid, Venous 3.4 (HH) 0.5 - 2.0 mmol/L    Comment: CRITICAL RESULT CALLED TO, READ BACK BY AND VERIFIED WITH: S.ODOM,RN AT 0400 ON 09/19/15 BY W.SHEA   Glucose,  capillary     Status: Abnormal   Collection Time: 09-19-15  2:15 AM  Result Value Ref Range   Glucose-Capillary 336 (H) 65 - 99 mg/dL  Basic metabolic panel     Status: Abnormal   Collection Time: Sep 19, 2015  2:20 AM  Result Value Ref Range   Sodium 132 (L) 135 - 145 mmol/L   Potassium 4.3 3.5 - 5.1 mmol/L   Chloride 107 101 - 111 mmol/L   CO2 15 (L) 22 - 32 mmol/L   Glucose, Bld 329 (H) 65 - 99 mg/dL   BUN 31 (H) 6 - 20 mg/dL   Creatinine, Ser 3.03 (H) 0.44 - 1.00 mg/dL   Calcium 7.7 (L) 8.9 - 10.3 mg/dL   GFR calc non Af Amer 16 (L) >60 mL/min   GFR calc Af Amer 19 (L) >60 mL/min    Comment: (NOTE) The eGFR has been calculated using the CKD EPI equation. This calculation has not been validated in all clinical situations. eGFR's persistently <60 mL/min signify possible Chronic Kidney Disease.    Anion gap 10 5 - 15  Magnesium     Status: None   Collection Time: 09-19-2015  2:20 AM  Result Value Ref Range   Magnesium 1.7 1.7 - 2.4 mg/dL  Phosphorus     Status: None   Collection Time: 09-19-2015  2:20 AM  Result Value Ref Range   Phosphorus 3.7 2.5 - 4.6 mg/dL  CBC     Status: Abnormal   Collection Time: 2015/09/19  2:20 AM  Result Value Ref Range   WBC 26.5 (H) 4.0 - 10.5 K/uL   RBC 4.38 3.87 - 5.11 MIL/uL   Hemoglobin 13.0 12.0 - 15.0 g/dL   HCT 38.3 36.0 - 46.0 %   MCV 87.4 78.0 - 100.0 fL   MCH 29.7 26.0 - 34.0 pg   MCHC 33.9 30.0 - 36.0 g/dL   RDW 13.7 11.5 - 15.5 %   Platelets 355 150 - 400 K/uL  Procalcitonin     Status: None   Collection Time: 19-Sep-2015  2:20 AM  Result Value Ref Range   Procalcitonin 135.86 ng/mL    Comment:        Interpretation: PCT >= 10 ng/mL: Important systemic inflammatory response, almost exclusively due to severe bacterial sepsis or septic shock. (NOTE)         ICU PCT Algorithm               Non ICU PCT Algorithm    ----------------------------     ------------------------------         PCT < 0.25 ng/mL                 PCT < 0.1  ng/mL     Stopping of antibiotics            Stopping  of antibiotics       strongly encouraged.               strongly encouraged.    ----------------------------     ------------------------------       PCT level decrease by               PCT < 0.25 ng/mL       >= 80% from peak PCT       OR PCT 0.25 - 0.5 ng/mL          Stopping of antibiotics                                             encouraged.     Stopping of antibiotics           encouraged.    ----------------------------     ------------------------------       PCT level decrease by              PCT >= 0.25 ng/mL       < 80% from peak PCT        AND PCT >= 0.5 ng/mL             Continuing antibiotics                                              encouraged.       Continuing antibiotics            encouraged.    ----------------------------     ------------------------------     PCT level increase compared          PCT > 0.5 ng/mL         with peak PCT AND          PCT >= 0.5 ng/mL             Escalation of antibiotics                                          strongly encouraged.      Escalation of antibiotics        strongly encouraged.   Glucose, capillary     Status: Abnormal   Collection Time: 09-17-2015  3:15 AM  Result Value Ref Range   Glucose-Capillary 298 (H) 65 - 99 mg/dL  Glucose, capillary     Status: Abnormal   Collection Time: September 17, 2015  4:16 AM  Result Value Ref Range   Glucose-Capillary 310 (H) 65 - 99 mg/dL  Glucose, capillary     Status: Abnormal   Collection Time: 17-Sep-2015  5:12 AM  Result Value Ref Range   Glucose-Capillary 270 (H) 65 - 99 mg/dL  Blood gas, arterial     Status: Abnormal   Collection Time: 17-Sep-2015  5:20 AM  Result Value Ref Range   FIO2 0.90    Delivery systems VENTILATOR    Mode PRESSURE REGULATED VOLUME CONTROL    VT 530 mL   LHR 20 resp/min   Peep/cpap 14.0 cm H20   pH, Arterial 7.284 (L) 7.350 - 7.450   pCO2 arterial 27.5 (L) 35.0 - 45.0  mmHg   pO2, Arterial 70.6 (L) 80.0 -  100.0 mmHg   Bicarbonate 12.5 (L) 20.0 - 24.0 mEq/L   TCO2 11.5 0 - 100 mmol/L   Acid-base deficit 12.4 (H) 0.0 - 2.0 mmol/L   O2 Saturation 91.5 %   Patient temperature 99.4    Collection site RIGHT RADIAL    Drawn by [id number]    Sample type ARTERIAL    Allens test (pass/fail) PASS PASS  Glucose, capillary     Status: Abnormal   Collection Time: 2015-09-30  6:05 AM  Result Value Ref Range   Glucose-Capillary 246 (H) 65 - 99 mg/dL     ROS:  Review of systems not obtained due to patient factors.  Physical Exam: Filed Vitals:   30-Sep-2015 1145 09/30/15 1200  BP: 118/60 99/45  Pulse: 47 45  Temp:    Resp: 29 24     General: Morbidly obese black female sedated on ventilator  HEENT: Pupils are equally round and reactive to light, mucous members moist  Neck: Positive for JVD  Heart: Bradycardic  Lungs: Coarse breath sounds bilaterally  Abdomen: Firm, decreased bowel sounds  Extremities: Pitting edema  Skin: Warm and dry  Neuro: Sedated on vent  Assessment/Plan: 55 year old black female with multi prescription drug overdose - unfortunately has developed respiratory compromise, hemodynamic compromise and now acute kidney injury  1.Renal-  appears to have normal renal function at baseline. Acute kidney injury likely secondary to ATN from bradycardia and hypotension. Infiltrate ongoing. Patient is now oliguric. By laboratories, she does not really have any acute indication for dialysis, however the thing that worries me is that she is quite hypoxic and requiring a large oxygen load so I discussed with CCM and feel the best course of action is to go ahead and initiate CRRT at this time.  This plan was discussed with the patient's son who is at bedside and also over the phone with the patient's brother who is an OB/GYN   2. Hypertension/volume - is 10 L positive - oliguric - is requiring 90% FiO2 with sats under 90  when initiating CRRT will attempt volume removal right away - only 50 per hour  but will titrate up as able  3. Acidosis - elevated lactate which is trending for the better due to hypotension. Dialysis should assist with acidosis  4. Anemia  - fortunately is not an issue at this time. It is noted the patient is a [employer] Witness  Kimberly Mcintyre A Sep 30, 2015, 12:43 PM

## 2015-09-09 NOTE — Progress Notes (Signed)
240 mL of Fentanyl was wasted down the sink with Eddie Candleavid Tuchman, RN as a witness.   Sharl MaBri Darius Fillingim, RN

## 2015-09-09 NOTE — Discharge Summary (Signed)
Kimberly Mcintyre was a 55 y.o. female admitted on 2016/03/28 after intentional ingestion of verapamil, xanax, melatonin, and zoloft.  She required intubation for airway protection initially.  She was started on glucagon and insulin infusions.  She required pressors agents for bradycardia and hypotension.  She had initial improvement in her mental status.  She then developed progressive pulmonary infiltrate.  Her procalcitonin was elevated.  She was started on antibiotics for pneumonia.  She had progressive renal failure with acidosis.  She was started on bicarbonate infusion.  She was seen by nephrology and plan was to start on CRRT.  She suddenly developed severe bradycardia and then asystole.  Resuscitative efforts were immediately started, but were unsuccessful.  She expired on 09/05/2015.  Final diagnoses: Asystolic cardiac arrest Intentional overdose with verapamil, xanax, melatonin, and zoloft Bradycardia Septic shock Acute hypoxic respiratory failure ARDS Aspiration pneumonia Acute kidney injury Lactic acidosis Non anion gap metabolic acidosis Hyperglycemia Hyperkalemia Hypokalemia Hypophosphatemia Hypomagnesemia Leukocytosis Jehovah's Witness Acute metabolic encephalopathy  Kimberly HellingVineet Raynell Scott, MD [facility] 08/18/2015, 5:44 PM

## 2015-09-09 DEATH — deceased

## 2017-04-29 IMAGING — DX DG CHEST 1V PORT
1 series · 1 of 1 positions shown · non-contrast
Comparison: 08/29/2015

CLINICAL DATA: Endotracheal tube placement.

EXAM:
PORTABLE CHEST 1 VIEW

[chest ap]
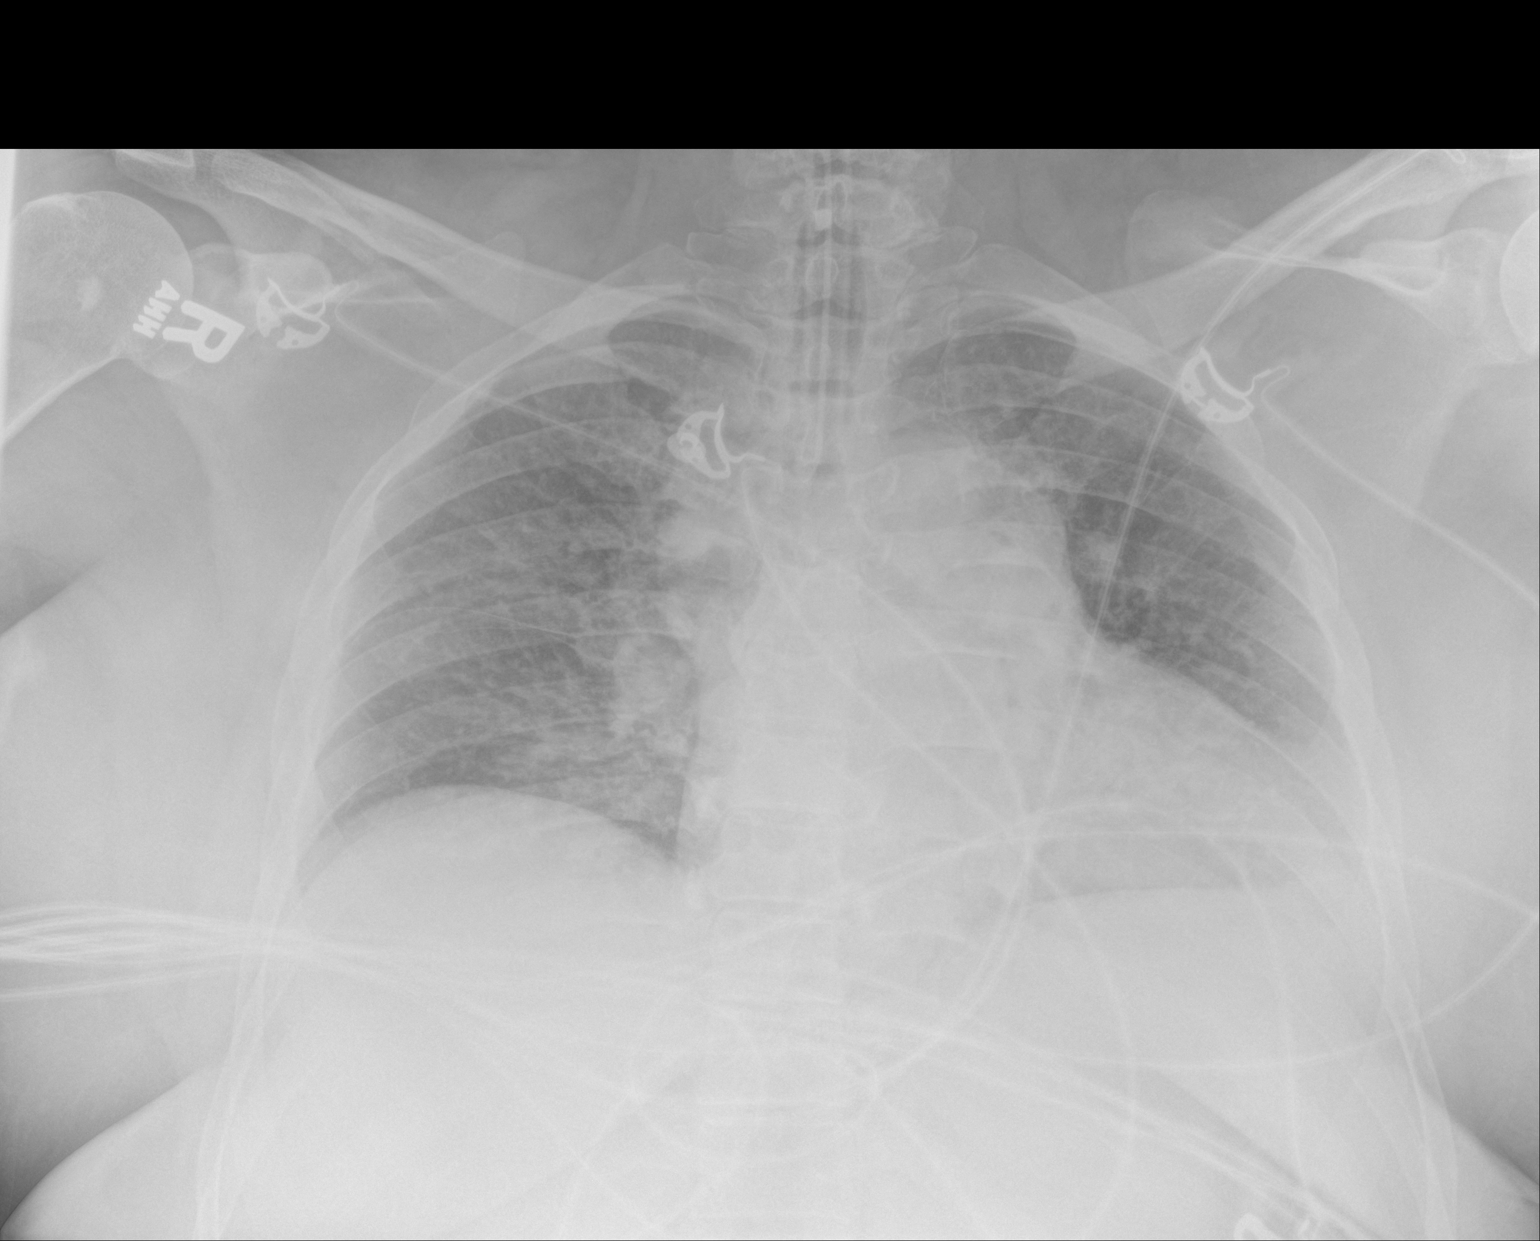

[1 of 1 positions shown; findings below may reference images not displayed]

FINDINGS: Endotracheal tube tip is 2 cm above the carina, well positioned.

Low lung volumes are present, causing crowding of the pulmonary
vasculature. Borderline cardiomegaly. Mild prominence of right upper
paratracheal tissues. Poor definition of the AP window. Indistinct
pulmonary vasculature. Improved aeration at the right lung base.
Faint interstitial accentuation. Questionable left perihilar
density.
IMPRESSION: 1. Endotracheal tube satisfactorily position with tip 2 cm above the
carina.
2. Low lung volumes.
3. Indistinct left perihilar density. Improved aeration at the right
lung base. Borderline appearance for interstitial accentuation.
4. Poor definition of the AP window and right paratracheal tissues.

## 2017-04-30 IMAGING — DX DG CHEST 1V PORT
1 series · 1 of 1 positions shown · non-contrast
Comparison: Chest radiograph performed 08/30/2015

CLINICAL DATA: Acute onset of respiratory failure and renal
failure. Initial encounter.

EXAM:
PORTABLE CHEST 1 VIEW

[chest ap]
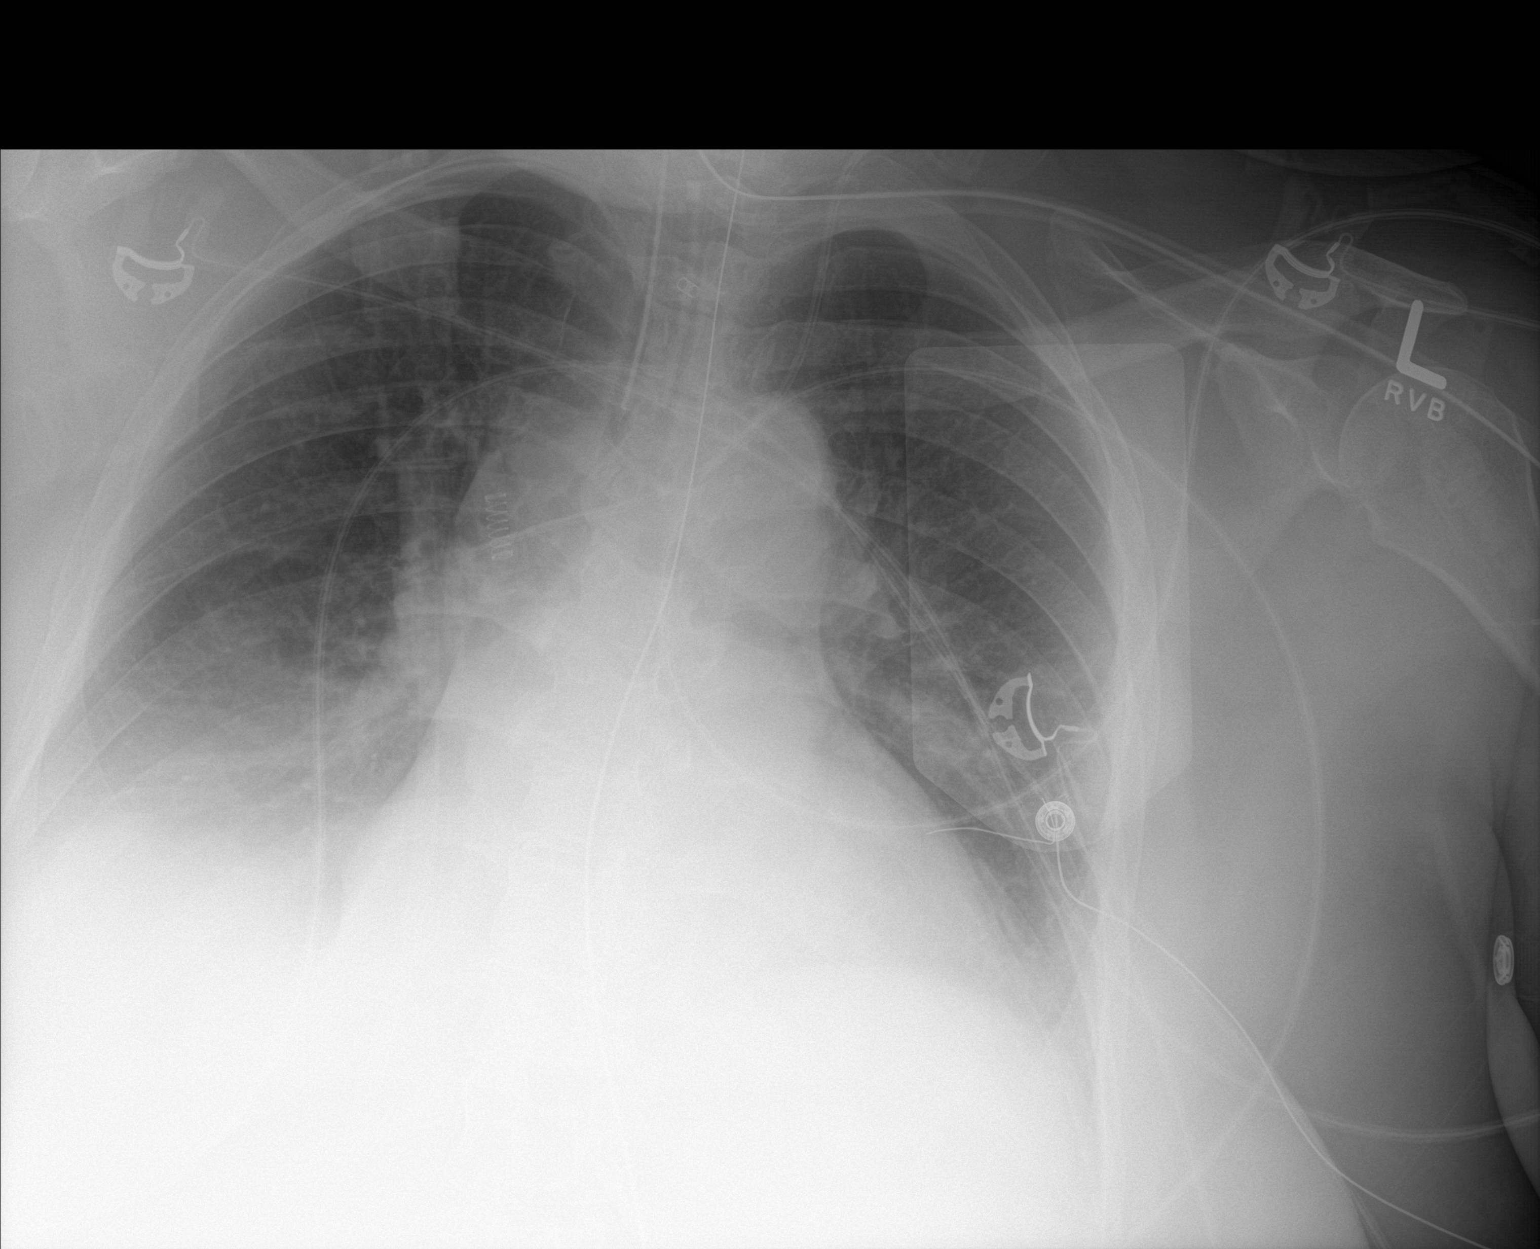

[1 of 1 positions shown; findings below may reference images not displayed]

FINDINGS: The patient's endotracheal tube is seen ending 3 cm above the
carina. An enteric tube is noted extending below the diaphragm.

Right basilar airspace opacity may reflect pneumonia. A small right
pleural effusion is suspected. No pneumothorax is seen.

The cardiomediastinal silhouette is mildly enlarged. No acute
osseous abnormalities are identified. A left IJ line is noted ending
about the mid SVC. An external pacing pad is noted.
IMPRESSION: 1. Endotracheal tube seen ending 3 cm above the carina.
2. Right basilar airspace opacity may reflect pneumonia. Small right
pleural effusion suspected.
3. Mild cardiomegaly.

## 2017-04-30 IMAGING — DX DG ABD PORTABLE 1V
1 series · 1 of 1 positions shown · non-contrast
Comparison: None.

CLINICAL DATA: NG tube placement.

EXAM:
PORTABLE ABDOMEN - 1 VIEW

[abdomen kub]
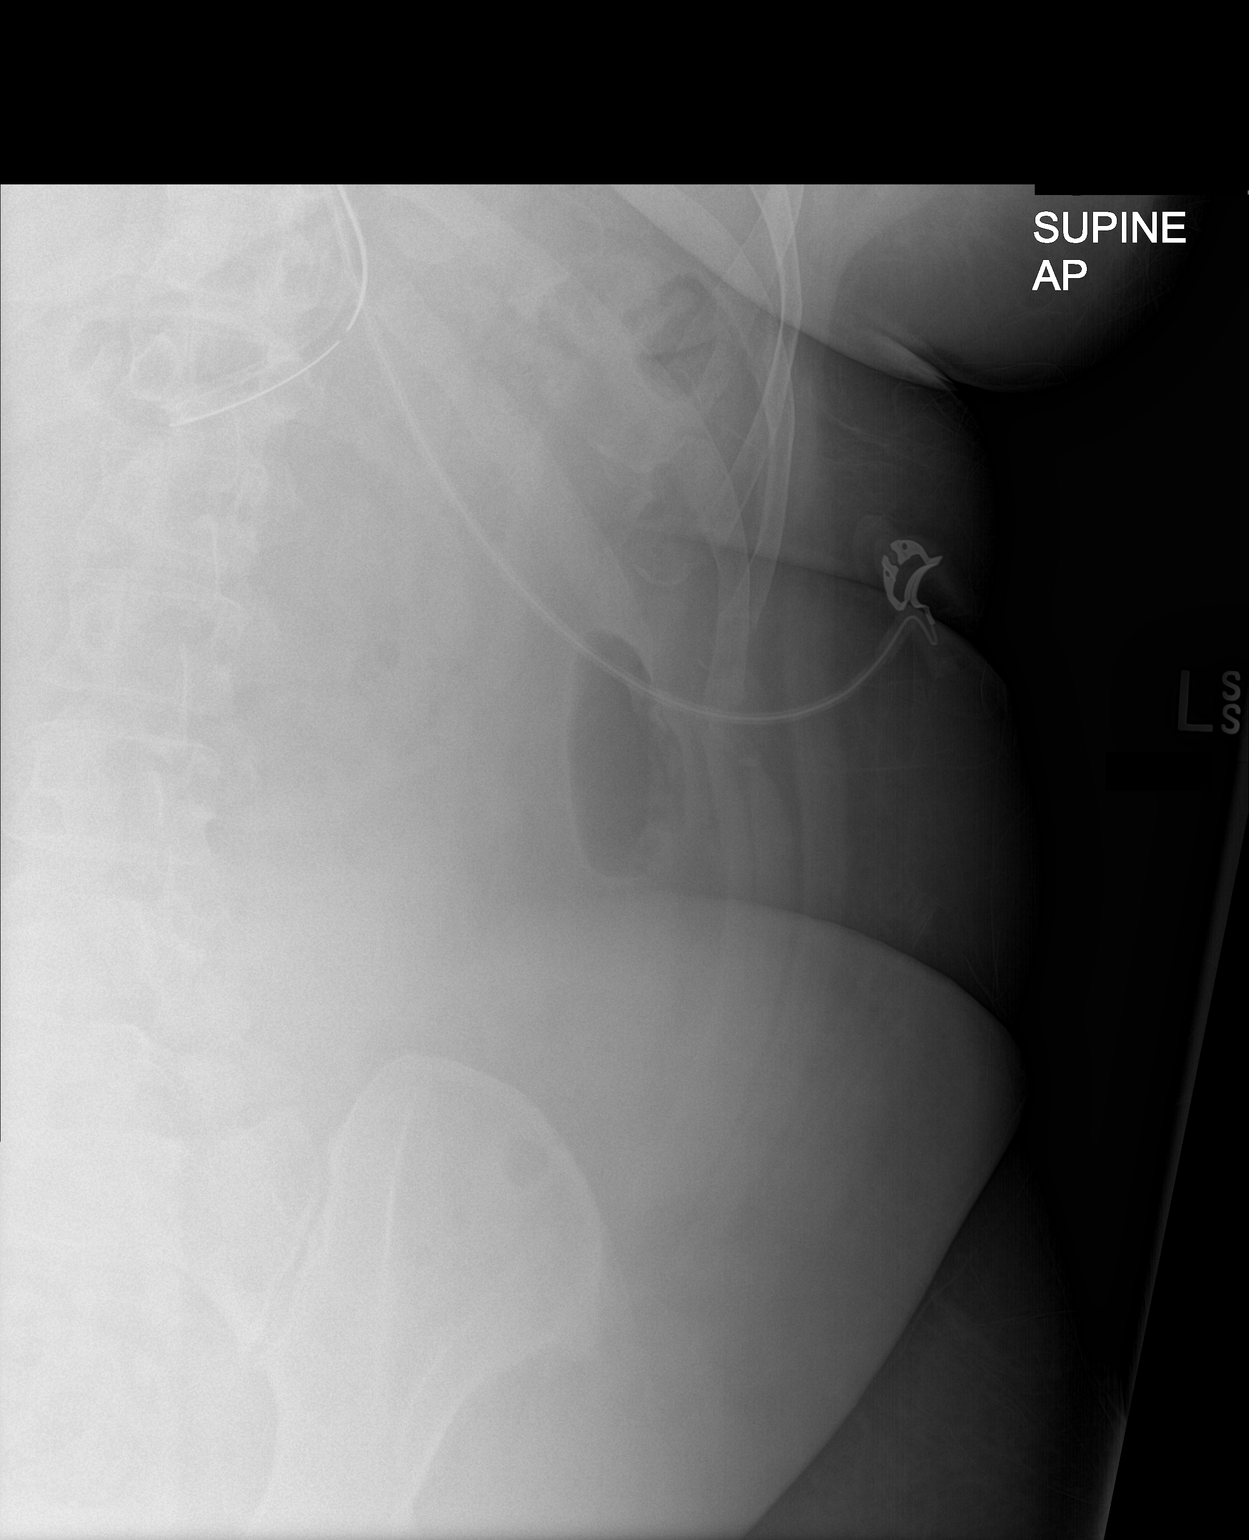

[1 of 1 positions shown; findings below may reference images not displayed]

FINDINGS: Tip and side port of the enteric tube in the upper abdomen below the
diaphragm in the stomach. No dilated bowel loops in the included
left abdomen.
IMPRESSION: Tip and side port of the enteric tube below the diaphragm in the
stomach.

## 2017-04-30 IMAGING — DX DG CHEST 1V PORT
1 series · 1 of 1 positions shown · non-contrast
Comparison: Same day.

CLINICAL DATA: Encounter for central line placement.

EXAM:
PORTABLE CHEST 1 VIEW

[chest ap]
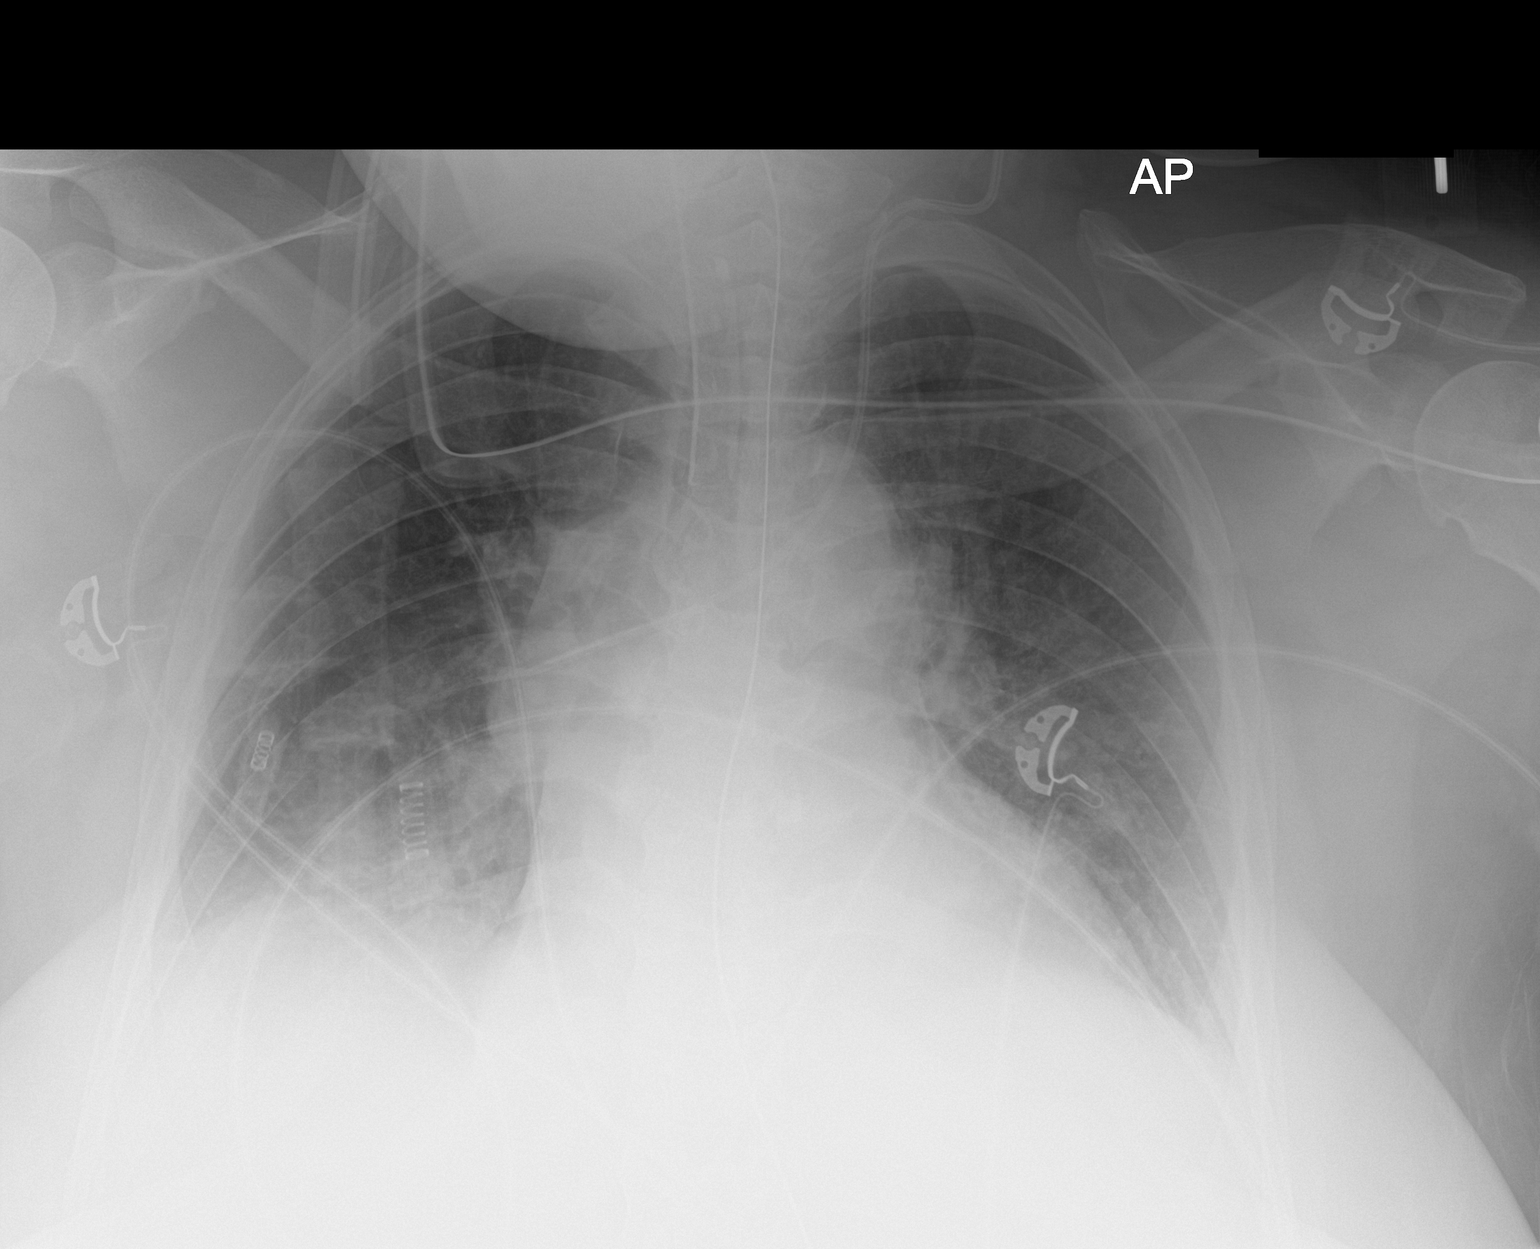

[1 of 1 positions shown; findings below may reference images not displayed]

FINDINGS: Stable cardiomediastinal silhouette. Endotracheal nasogastric tubes
are unchanged in position. No pneumothorax is noted. Mildly
increased right basilar opacity is noted suggesting worsening
atelectasis or infiltrate. Interval placement of left internal
jugular catheter with distal tip in expected position of the SVC.
Bony thorax is unremarkable.
IMPRESSION: Stable support apparatus. Interval placement of left internal
jugular catheter with distal tip in expected position of the SVC.
Increased right basilar opacity is noted concerning for worsening
atelectasis or possibly pneumonia.

## 2017-05-01 IMAGING — DX DG CHEST 1V PORT
1 series · 1 of 1 positions shown · non-contrast
Comparison: 08/30/2015.

CLINICAL DATA: Central line placement.

EXAM:
PORTABLE CHEST 1 VIEW

[chest ap]
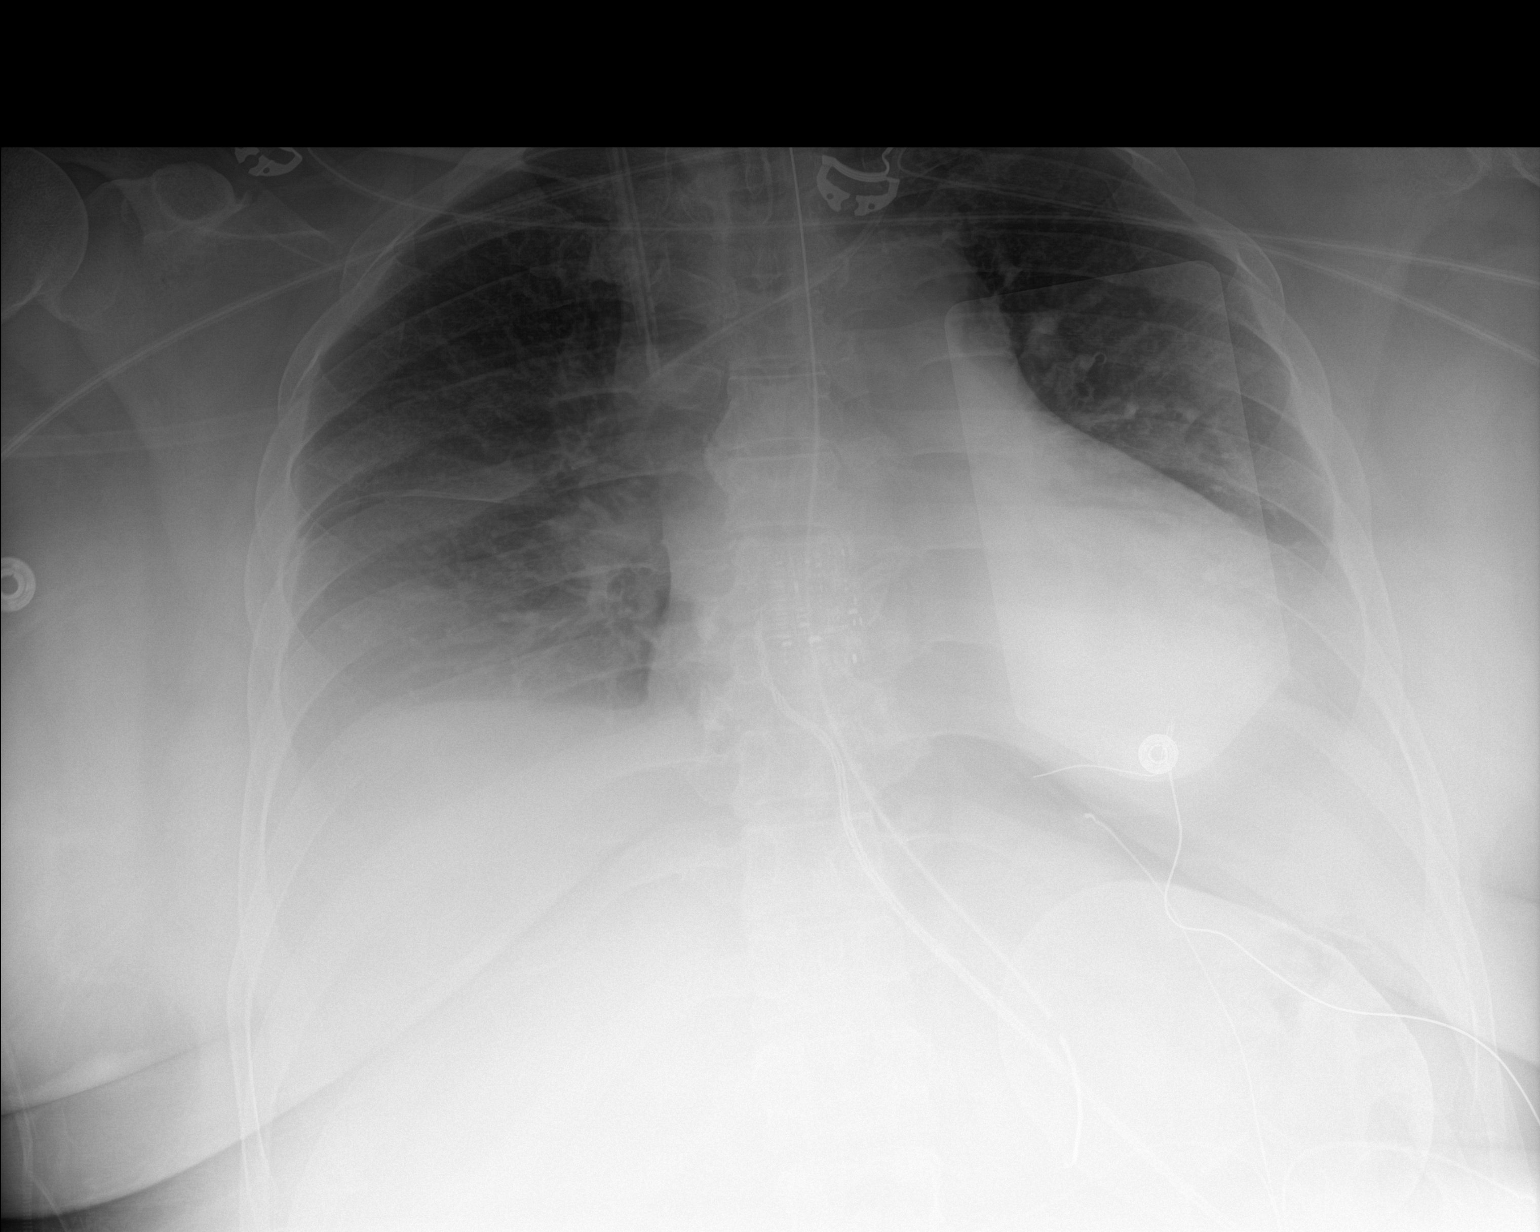

[1 of 1 positions shown; findings below may reference images not displayed]

FINDINGS: Interim placement of right IJ line, its tip is projected over the
superior vena cava. Left IJ line, endotracheal tube, NG tube in
stable position. Cardiomediastinal silhouette remains prominent.
Bibasilar pulmonary infiltrates and or edema again noted. Small
bilateral pleural effusions again noted. Congestive heart failure
cannot be excluded. Bilateral pneumonia cannot be excluded. No
pneumothorax.
IMPRESSION: 1. Interim placement of right IJ line, its tip is projected over the
superior vena cava. Remaining lines and tubes in stable position.
2. Cardiomediastinal silhouette remains prominent. Bilateral
pulmonary infiltrates and/or edema again noted. Bilateral small
pleural effusions again noted. Congestive heart failure and/or
bilateral pneumonia could present in this fashion.
